# Patient Record
Sex: Female | Born: 1950 | Race: Black or African American | Hispanic: No | Marital: Married | Smoking: Former smoker
Health system: Southern US, Community
[De-identification: ages and names within clinical notes are randomized; demographics above are authoritative.]

## PROBLEM LIST (undated history)

## (undated) DIAGNOSIS — N189 Chronic kidney disease, unspecified: Secondary | ICD-10-CM

## (undated) DIAGNOSIS — E785 Hyperlipidemia, unspecified: Secondary | ICD-10-CM

## (undated) DIAGNOSIS — K759 Inflammatory liver disease, unspecified: Secondary | ICD-10-CM

## (undated) DIAGNOSIS — D869 Sarcoidosis, unspecified: Secondary | ICD-10-CM

## (undated) DIAGNOSIS — E119 Type 2 diabetes mellitus without complications: Secondary | ICD-10-CM

## (undated) DIAGNOSIS — D649 Anemia, unspecified: Secondary | ICD-10-CM

## (undated) HISTORY — PX: ABDOMINAL HYSTERECTOMY: SHX81

## (undated) HISTORY — DX: Type 2 diabetes mellitus without complications: E11.9

---

## 2008-04-30 ENCOUNTER — Ambulatory Visit: Payer: Self-pay | Admitting: Cardiology

## 2008-05-07 ENCOUNTER — Ambulatory Visit: Payer: Self-pay | Admitting: Cardiology

## 2010-09-30 NOTE — Assessment & Plan Note (Signed)
[place] [facility] OFFICE NOTE   NAME:Kristen Mcknight, Kristen Mcknight                       MRN:          [id number]  DATE:04/30/2008                            DOB:          Apr 12, 1951    REFERRING PHYSICIAN:  Consuello Masse.   REASON FOR CONSULTATION:  Chest pain and abnormal electrocardiogram.   HISTORY OF PRESENT ILLNESS:  Kristen Mcknight is a very pleasant 60 year old  woman with a history of hyperlipidemia and sarcoidosis, apparently in  remission.  She reports being diagnosed with sarcoidosis approximately 5  years ago and was on steroids for a temporary treatment course, although  has been very stable recently.  She has no long-term history of  medication-treated hypertension or type 2 diabetes mellitus.  She also  has no personal history of cardiovascular disease.  She has been  experiencing intermittent chest pressure in sporadic fashion over  several weeks.  Sometimes these symptoms may last up to a day at a time  and she states that she takes a baby aspirin ultimately with her  symptoms subsiding.  These are nonexertional and can be exacerbated by  arm movement and twisting sometimes but not exclusively.  She has  undergone no prior cardiac risk stratification.  Reviewing her  electrocardiograms, she does have diminished R-wave progression  throughout the precordium and a leftward axis with nonspecific ST-T-wave  changes.   ALLERGIES:  CODEINE.   MEDICATIONS:  Zocor 20 mg p.o. at bedtime.   PAST MEDICAL HISTORY:  The patient is status post hysterectomy in 1998.  She has hyperlipidemia treated with Zocor.  Recent lipids in November  showed an LDL of 124, HDL 38, total cholesterol 184, and triglycerides  of 109.  She has no history of thyroid disease, renal disease, or  cancer.   FAMILY HISTORY:  Reviewed.  The patient's mother died at age 55 with  asthma and myocardial infarction.  Her father died at age 39 with lung  cancer.  She has 8 sisters and 4 brothers, none with premature  cardiovascular disease.   SOCIAL HISTORY:  The patient is married, currently separated.  She has 2  children.  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  She had a previous history of tobacco use.  Drinks alcoholic beverage on occasion.  She exercises perhaps twice a  week by walking.   REVIEW OF SYSTEMS:  As outlined above.  She does have occasional  constipation.  Otherwise negative.   PHYSICAL EXAMINATION:  VITAL SIGNS:  Blood pressure is 128/80, heart  rate is 76, weight is 171 pounds.  GENERAL:  A normally nourished-appearing woman in no acute distress.  HEENT:  Conjunctiva is normal.  She does have resolving ecchymoses in  her lower orbital area.  She states that she recently had a fall,  otherwise with no major injury.  Oropharynx is clear.  NECK:  Supple.  No elevated jugular venous pressure.  No loud bruits.  No thyromegaly.  LUNGS:  Clear without labored breathing at rest.  CARDIOVASCULAR:  Regular rate and rhythm.  No obvious pericardial  rub or  S3 gallop.  No pathologic systolic murmur.  ABDOMEN:  Soft, nontender.  EXTREMITIES:  No frank pitting edema.  Pulses are 1-2+.  SKIN:  Warm and dry.  MUSCULOSKELETAL:  No kyphosis noted.  NEUROPSYCHIATRIC:  The patient is alert and oriented x3.  Affect is  appropriate.   IMPRESSION AND RECOMMENDATIONS:  1. Chest pain syndrome with somewhat atypical features in the setting      of hyperlipidemia and a previous history of tobacco use (quit in      January).  Her electrocardiogram is abnormal with decreased R-wave      progression and nonspecific ST-T-wave changes.  We discussed      further risk stratification and we will plan an exercise      echocardiogram.  If this is normal, it would suggest a very low      risk for underlying obstructive coronary artery disease and      myocardial infarction.  In that case, we will  refer her back to Dr.      Quintin Alto for ongoing care.  If abnormal results are noted, then we      will discuss situation further here in the office.  2. Hyperlipidemia, presently on Zocor.  This is being managed by Dr.      Quintin Alto.  3. History of sarcoidosis, reportedly in remission.     Satira Sark, MD  Electronically Signed    SGM/MedQ  DD: 04/30/2008  DT: 05/01/2008  Job #: [id number]   cc:   Consuello Masse

## 2015-05-09 ENCOUNTER — Inpatient Hospital Stay (HOSPITAL_COMMUNITY)
Admission: EM | Admit: 2015-05-09 | Discharge: 2015-05-13 | DRG: 690 | Disposition: A | Discharge status: Home / Self Care | Payer: PRIVATE HEALTH INSURANCE | Attending: Internal Medicine | Admitting: Internal Medicine

## 2015-05-09 ENCOUNTER — Encounter (HOSPITAL_COMMUNITY): Payer: Self-pay | Admitting: *Deleted

## 2015-05-09 ENCOUNTER — Emergency Department (HOSPITAL_COMMUNITY): Payer: PRIVATE HEALTH INSURANCE

## 2015-05-09 DIAGNOSIS — Z87891 Personal history of nicotine dependence: Secondary | ICD-10-CM | POA: Diagnosis not present

## 2015-05-09 DIAGNOSIS — E785 Hyperlipidemia, unspecified: Secondary | ICD-10-CM | POA: Diagnosis present

## 2015-05-09 DIAGNOSIS — Z801 Family history of malignant neoplasm of trachea, bronchus and lung: Secondary | ICD-10-CM

## 2015-05-09 DIAGNOSIS — Z79899 Other long term (current) drug therapy: Secondary | ICD-10-CM | POA: Diagnosis not present

## 2015-05-09 DIAGNOSIS — R310 Gross hematuria: Secondary | ICD-10-CM | POA: Diagnosis present

## 2015-05-09 DIAGNOSIS — N189 Chronic kidney disease, unspecified: Secondary | ICD-10-CM | POA: Diagnosis present

## 2015-05-09 DIAGNOSIS — Z885 Allergy status to narcotic agent status: Secondary | ICD-10-CM | POA: Diagnosis not present

## 2015-05-09 DIAGNOSIS — N10 Acute tubulo-interstitial nephritis: Secondary | ICD-10-CM | POA: Diagnosis present

## 2015-05-09 DIAGNOSIS — Z825 Family history of asthma and other chronic lower respiratory diseases: Secondary | ICD-10-CM | POA: Diagnosis not present

## 2015-05-09 DIAGNOSIS — Z8249 Family history of ischemic heart disease and other diseases of the circulatory system: Secondary | ICD-10-CM

## 2015-05-09 DIAGNOSIS — B182 Chronic viral hepatitis C: Secondary | ICD-10-CM | POA: Diagnosis present

## 2015-05-09 DIAGNOSIS — N179 Acute kidney failure, unspecified: Secondary | ICD-10-CM | POA: Diagnosis present

## 2015-05-09 DIAGNOSIS — N3289 Other specified disorders of bladder: Secondary | ICD-10-CM | POA: Diagnosis present

## 2015-05-09 DIAGNOSIS — D62 Acute posthemorrhagic anemia: Secondary | ICD-10-CM | POA: Diagnosis present

## 2015-05-09 DIAGNOSIS — D869 Sarcoidosis, unspecified: Secondary | ICD-10-CM | POA: Diagnosis present

## 2015-05-09 HISTORY — DX: Chronic kidney disease, unspecified: N18.9

## 2015-05-09 HISTORY — DX: Hyperlipidemia, unspecified: E78.5

## 2015-05-09 HISTORY — DX: Anemia, unspecified: D64.9

## 2015-05-09 HISTORY — DX: Inflammatory liver disease, unspecified: K75.9

## 2015-05-09 LAB — I-STAT CHEM 8, ED
BUN: 41 mg/dL — AB (ref 6–20)
CHLORIDE: 111 mmol/L (ref 101–111)
CREATININE: 4.7 mg/dL — AB (ref 0.44–1.00)
Calcium, Ion: 1.13 mmol/L (ref 1.13–1.30)
Glucose, Bld: 102 mg/dL — ABNORMAL HIGH (ref 65–99)
HEMATOCRIT: 23 % — AB (ref 36.0–46.0)
Hemoglobin: 7.8 g/dL — ABNORMAL LOW (ref 12.0–15.0)
Potassium: 3.8 mmol/L (ref 3.5–5.1)
Sodium: 142 mmol/L (ref 135–145)
TCO2: 19 mmol/L (ref 0–100)

## 2015-05-09 LAB — I-STAT CG4 LACTIC ACID, ED: Lactic Acid, Venous: 1.5 mmol/L (ref 0.5–2.0)

## 2015-05-09 LAB — CBC WITH DIFFERENTIAL/PLATELET
BASOS ABS: 0 10*3/uL (ref 0.0–0.1)
BASOS PCT: 0 %
EOS ABS: 0.3 10*3/uL (ref 0.0–0.7)
EOS PCT: 3 %
HEMATOCRIT: 21.8 % — AB (ref 36.0–46.0)
Hemoglobin: 7.2 g/dL — ABNORMAL LOW (ref 12.0–15.0)
Lymphocytes Relative: 16 %
Lymphs Abs: 1.6 10*3/uL (ref 0.7–4.0)
MCH: 30.4 pg (ref 26.0–34.0)
MCHC: 33 g/dL (ref 30.0–36.0)
MCV: 92 fL (ref 78.0–100.0)
MONO ABS: 0.6 10*3/uL (ref 0.1–1.0)
MONOS PCT: 6 %
Neutro Abs: 7.6 10*3/uL (ref 1.7–7.7)
Neutrophils Relative %: 75 %
PLATELETS: 312 10*3/uL (ref 150–400)
RBC: 2.37 MIL/uL — ABNORMAL LOW (ref 3.87–5.11)
RDW: 14.3 % (ref 11.5–15.5)
WBC: 10.1 10*3/uL (ref 4.0–10.5)

## 2015-05-09 LAB — APTT: APTT: 39 s — AB (ref 24–37)

## 2015-05-09 LAB — PROTIME-INR
INR: 1.19 (ref 0.00–1.49)
PROTHROMBIN TIME: 15.2 s (ref 11.6–15.2)

## 2015-05-09 LAB — ABO/RH: ABO/RH(D): O POS

## 2015-05-09 MED ORDER — TETRAHYDROZOLINE HCL 0.05 % OP SOLN: 1.0000 [drp] | Freq: Every day | OPHTHALMIC | Status: DC | PRN

## 2015-05-09 MED ORDER — SODIUM CHLORIDE 0.9 % IJ SOLN
3.0000 mL | Freq: Two times a day (BID) | INTRAMUSCULAR | Status: DC
  Administered 2015-05-12 – 2015-05-13 (×3): 3 mL via INTRAVENOUS

## 2015-05-09 MED ORDER — PANTOPRAZOLE SODIUM 20 MG PO TBEC
20.0000 mg | DELAYED_RELEASE_TABLET | Freq: Every day | ORAL | Status: DC
  Administered 2015-05-11 – 2015-05-13 (×3): 20 mg via ORAL
  Filled 2015-05-09 (×6): qty 1

## 2015-05-09 MED ORDER — SODIUM CHLORIDE 0.9 % IV BOLUS (SEPSIS)
1000.0000 mL | Freq: Once | INTRAVENOUS | Status: AC
  Administered 2015-05-09: 1000 mL via INTRAVENOUS

## 2015-05-09 MED ORDER — SODIUM CHLORIDE 0.9 % IV SOLN
INTRAVENOUS | Status: DC
  Administered 2015-05-09 – 2015-05-11 (×4): via INTRAVENOUS

## 2015-05-09 MED ORDER — DEXTROSE 5 % IV SOLN
1.0000 g | INTRAVENOUS | Status: DC
  Administered 2015-05-09 – 2015-05-12 (×4): 1 g via INTRAVENOUS
  Filled 2015-05-09 (×4): qty 10

## 2015-05-09 MED ORDER — ACETAMINOPHEN 650 MG RE SUPP: 650.0000 mg | Freq: Four times a day (QID) | RECTAL | Status: DC | PRN

## 2015-05-09 MED ORDER — POLYETHYLENE GLYCOL 3350 17 G PO PACK: 17.0000 g | PACK | Freq: Every day | ORAL | Status: DC | PRN

## 2015-05-09 MED ORDER — ACETAMINOPHEN 325 MG PO TABS: 650.0000 mg | ORAL_TABLET | Freq: Four times a day (QID) | ORAL | Status: DC | PRN

## 2015-05-09 MED ORDER — ONDANSETRON HCL 4 MG/2ML IJ SOLN: 4.0000 mg | Freq: Three times a day (TID) | INTRAMUSCULAR | Status: AC | PRN

## 2015-05-09 NOTE — ED Notes (Signed)
Pt is here from [facility], she was being evaluated in the ER and was found to have blood in urine. She reported that she had a biopsy in her kidneys Thurs. For kidney failure. Per her previous lab work she was in kidney failure and that is why the biopsy was requested. Results unknown. EMS reports that her doctor requested the transfer.

## 2015-05-09 NOTE — Consult Note (Signed)
Urology Consult  Referring physician: Dr. Eulis Foster Reason for referral: Hematuria, clot retention  Chief Complaint: suprapubic pain  History of Present Illness: Ms Kristen Mcknight is a 64yo with a hx of interstitial nephritis who presents to [facility] today with complaints of gross hematuria and inability to urinate. CT scan shows a large clot burden in the bladder. She underwent kidney biopsy 1 week ago which was complicated by gross hematuria following the biopsy. Her urine cleared after 2 days and she was discharged home. Yesterday she started having gross hematuria and today she was unable to urinate. She denies any dysuria but did have urgency prior to foley placement. She was transferred on CBI. No previous episodes of gross hematuria or LUTS.   Past Medical History  Diagnosis Date  . Hyperlipidemia    No past surgical history on file.  Medications: I have reviewed the patient's current medications. Allergies:  Allergies  Allergen Reactions  . Codeine Hives    No family history on file. Social History:  has no tobacco, alcohol, and drug history on file.  Review of Systems  Constitutional: Positive for malaise/fatigue.  Gastrointestinal: Positive for nausea.  Genitourinary: Positive for hematuria.  All other systems reviewed and are negative.   Physical Exam:  Vital signs in last 24 hours: Temp:  [98.1 F (36.7 C)] 98.1 F (36.7 C) (12/22 1509) Pulse Rate:  [64] 64 (12/22 1509) Resp:  [16] 16 (12/22 1509) BP: (150)/(68) 150/68 mmHg (12/22 1509) SpO2:  [96 %-98 %] 98 % (12/22 1509) Physical Exam  Constitutional: She is oriented to person, place, and time. She appears well-developed and well-nourished.  HENT:  Head: Normocephalic and atraumatic.  Eyes: EOM are normal. Pupils are equal, round, and reactive to light.  Neck: Normal range of motion. No thyromegaly present.  Cardiovascular: Normal rate and regular rhythm.   Respiratory: Effort normal. No respiratory  distress.  GI: Soft. She exhibits no distension.  Musculoskeletal: Normal range of motion.  Neurological: She is alert and oriented to person, place, and time.  Skin: Skin is warm and dry.  Psychiatric: She has a normal mood and affect. Her behavior is normal. Judgment and thought content normal.    Laboratory Data:  No results found for this or any previous visit (from the past 72 hour(s)). No results found for this or any previous visit (from the past 240 hour(s)). Creatinine: No results for input(s): CREATININE in the last 168 hours. Baseline Creatinine: unknown  Impression/Assessment:  64yo with gross hematuria and clot retention  Plan:  1. 24 French foley catehter was placed and 100cc of clot was evacuated from the bladder. Bedside US confirmed no residual clot. The foley was then exchanged for a 20 French 3 way catheter and CBI was started 2. Continue CBI and titrate urine to light pink 3. Urology to continue to follow   [facility], Kristen Mcknight 05/09/2015, 4:46 PM

## 2015-05-09 NOTE — Progress Notes (Signed)
Continuing the CBI.  Titrating CBI until light pink. No clots seen since the patient arrived from the E.D. @ 1909. Will continue to monitor the patient.

## 2015-05-09 NOTE — ED Notes (Signed)
Bed: WA04 Expected date:  Expected time:  Means of arrival:  Comments: [place] txfr

## 2015-05-09 NOTE — ED Provider Notes (Signed)
CSN: [id number]     Arrival date & time 05/09/15  1503 History   First MD Initiated Contact with Patient 05/09/15 1521     Chief Complaint  Patient presents with  . Hematuria     (Consider location/radiation/quality/duration/timing/severity/associated sxs/prior Treatment) The history is provided by the patient.     Patient is a 64 year old female, who arrived to [facility] ER as a transfer from [place], where she was found to have a large hematoma in her bladder, with hydronephrosis with debris in the renal pelvis, status post left percutaneous kidney biopsy 1 week ago today.  She reports that she was in "critical renal failure" which was what prompted biopsy of her kidneys.  For the past two weeks she reports mild fatigue, but no other symptoms. The biopsy dx her with acute interstitial nephritis - per the pt, the report states acute tubular injury.  The pt states the the day of the bx she had mild hematuria, however the next day urine returned to normal clear and yellow.  She denies hematuria, flank pain, abdominal discomfort or any other symptoms until today. She reports that she was at work this morning at [employer], when she began to have suprapubic pressure, bilateral flank pain, with nausea and vomiting, hot flashes, chills, and then gross hematuria.  She reports that she had low blood pressure at the time she was evaluated there, and labs were obtained which were pertinent for BUN/sCR of 45/4.72, anion gap 24, lactic acid 2.3, leukocytosis 13.0, anemia Hb 8.5 down from 1 week prior Hb 9.4.  CT of her abdomen/pelvis revealed blood clots in her bladder, debris in her left kidney, with hydronephrosis.  There was apparently no covering physician available at [facility] to see the pt.  Dr. Alyson Ingles (urology) was notified about the pt prior to ER to ER transfer.  He was     Past Medical History  Diagnosis Date  . Hyperlipidemia   . Chronic kidney disease   . Anemia   . Hepatitis    Past  Surgical History  Procedure Laterality Date  . Abdominal hysterectomy     Family History  Problem Relation Age of Onset  . Asthma Mother   . CAD Mother   . Lung cancer Father   . Lupus Other    Social History  Substance Use Topics  . Smoking status: Former Smoker    Quit date: 05/08/2014  . Smokeless tobacco: Never Used  . Alcohol Use: 1.8 oz/week    3 Glasses of wine per week   OB History    No data available     Review of Systems  Constitutional: Positive for chills and fatigue. Negative for fever, activity change and appetite change.  HENT: Negative.   Eyes: Negative.   Respiratory: Negative.  Negative for chest tightness and shortness of breath.   Cardiovascular: Negative.  Negative for chest pain, palpitations and leg swelling.  Gastrointestinal: Positive for nausea, vomiting and abdominal pain. Negative for diarrhea, constipation, blood in stool and abdominal distention.  Genitourinary: Positive for hematuria, flank pain and difficulty urinating. Negative for dysuria and menstrual problem.  Musculoskeletal: Negative.   Skin: Negative.  Negative for color change, pallor and rash.  Neurological: Negative.   Hematological: Negative.   Psychiatric/Behavioral: Negative.       Allergies  Codeine  Home Medications   Prior to Admission medications   Medication Sig Start Date End Date Taking? Authorizing Provider  fluticasone (FLONASE) 50 MCG/ACT nasal spray Place 2 sprays into  both nostrils daily as needed for allergies or rhinitis.   Yes Historical Provider, MD  loratadine (CLARITIN) 10 MG tablet Take 10 mg by mouth daily as needed for allergies.   Yes Historical Provider, MD  naproxen sodium (ANAPROX) 220 MG tablet Take 220 mg by mouth daily as needed (pain).   Yes Historical Provider, MD  nitroGLYCERIN (NITROSTAT) 0.4 MG SL tablet Place 0.4 mg under the tongue every 5 (five) minutes as needed for chest pain.    Yes Historical Provider, MD  pantoprazole (PROTONIX)  20 MG tablet Take 20 mg by mouth daily.   Yes Historical Provider, MD  simvastatin (ZOCOR) 40 MG tablet Take 40 mg by mouth daily.   Yes Historical Provider, MD  Tetrahydrozoline HCl (VISINE OP) Apply 1 drop to eye daily as needed (dry eyes).   Yes Historical Provider, MD   BP 120/55 mmHg  Pulse 76  Temp(Src) 98.8 F (37.1 C) (Oral)  Resp 16  Ht 5' 6"  (1.676 m)  Wt 72 kg  BMI 25.63 kg/m2  SpO2 98% Physical Exam  Constitutional: She is oriented to person, place, and time. She appears well-developed and well-nourished. No distress.  Well appearing female, laying comfortably in ER gurney  HENT:  Head: Normocephalic and atraumatic.  Nose: Nose normal.  Mouth/Throat: No oropharyngeal exudate.  MM dry, no pallor [place]bra conjunctiva pale pink  Eyes: Conjunctivae and EOM are normal. Pupils are equal, round, and reactive to light. Right eye exhibits no discharge. Left eye exhibits no discharge. No scleral icterus.  Neck: Normal range of motion. No JVD present. No tracheal deviation present. No thyromegaly present.  Cardiovascular: Normal rate, regular rhythm, normal heart sounds and intact distal pulses.  Exam reveals no gallop and no friction rub.   No murmur heard. Pulmonary/Chest: Effort normal. No respiratory distress. She has no wheezes. She has rales. She exhibits no tenderness.  Bibasilar fine rales, no rhonchi, no wheeze, no respiratory distress  Abdominal: Soft. Bowel sounds are normal. She exhibits no distension and no mass. There is tenderness. There is no rebound and no guarding.  Mildly tender suprapubic area, no guarding or rebound No CVA tenderness  Musculoskeletal: Normal range of motion. She exhibits no edema or tenderness.  Lymphadenopathy:    She has no cervical adenopathy.  Neurological: She is alert and oriented to person, place, and time. She has normal reflexes. No cranial nerve deficit. She exhibits normal muscle tone. Coordination normal.  Skin: Skin is warm and  dry. No rash noted. She is not diaphoretic. No erythema. No pallor.  Psychiatric: She has a normal mood and affect. Her behavior is normal. Judgment and thought content normal.  Nursing note and vitals reviewed.   ED Course  Procedures (including critical care time) Labs Review Labs Reviewed  APTT - Abnormal; Notable for the following:    aPTT 39 (*)    All other components within normal limits  BASIC METABOLIC PANEL - Abnormal; Notable for the following:    Chloride 113 (*)    CO2 21 (*)    Glucose, Bld 111 (*)    BUN 45 (*)    Creatinine, Ser 4.95 (*)    Calcium 8.6 (*)    GFR calc non Af Amer 9 (*)    GFR calc Af Amer 10 (*)    All other components within normal limits  CBC - Abnormal; Notable for the following:    RBC 2.41 (*)    Hemoglobin 7.4 (*)    HCT 22.3 (*)  All other components within normal limits  CBC WITH DIFFERENTIAL/PLATELET - Abnormal; Notable for the following:    RBC 2.37 (*)    Hemoglobin 7.2 (*)    HCT 21.8 (*)    All other components within normal limits  I-STAT CHEM 8, ED - Abnormal; Notable for the following:    BUN 41 (*)    Creatinine, Ser 4.70 (*)    Glucose, Bld 102 (*)    Hemoglobin 7.8 (*)    HCT 23.0 (*)    All other components within normal limits  URINE CULTURE  PROTIME-INR  CBC WITH DIFFERENTIAL/PLATELET  CBC  CBC  CBC  CBC  I-STAT CG4 LACTIC ACID, ED  I-STAT CG4 LACTIC ACID, ED  TYPE AND SCREEN  ABO/RH  PREPARE RBC (CROSSMATCH)    Imaging Review Dg Chest 2 View  05/09/2015  CLINICAL DATA:  Gross hematuria and unable to urinate. EXAM: CHEST  2 VIEW COMPARISON:  05/09/2015 FINDINGS: Heart and mediastinum are within normal limits. Trachea is midline. Both lungs are clear without airspace disease or pulmonary edema. No large pleural effusions. Degenerative change along the right side of the thoracic spine. IMPRESSION: No active cardiopulmonary disease. Electronically Signed   By: Markus Daft M.D.   On: 05/09/2015 18:18   I  have personally reviewed and evaluated these images and lab results as part of my medical decision-making.   EKG Interpretation None      MDM   Pt tx here from [facility] to see Urology, pt was seen, NAD, VSS, appears comfortable and stable, declines pain medication. Last ate at 11 am.  Was instructed to remain NPO Dr. Noah Delaine contacted - currently at bedside - 4:37 PM   Pt's records from [facility] were reviewed.  Given that she had renal failure (eGFR <15), lactic acid of 2.3, anion gap, and leukocytosis, fluids were ordered with repeat lactic acid, type and screen, CXR  Dr. Alyson Ingles requested that medicine be called to admit.  Labs show improvement of lactic acid to 1.50, BUN/sCr 41/4.70, Hb 7.8, suspect slight decrease in H/H is from dilution from IVF, AG cleared  [facility] hospitalist called for admission. Dr. Florene Glen to admit pt to tele for ARF, hematuria, urinary retention and hydronephrosis secondary to obstructing bladder hematoma.   Final diagnoses:  Pilar Plate hematuria       Delsa Grana, PA-C 05/10/15 6301  Daleen Bo, MD 05/10/15 2347

## 2015-05-09 NOTE — ED Notes (Signed)
Delay in lab draw, urologist in room at this time.

## 2015-05-09 NOTE — ED Notes (Signed)
Pt can go to floor at 18:40

## 2015-05-09 NOTE — H&P (Signed)
[facility] History and Physical  [place] Krekel [id number] DOB: January 13, 1951 DOA: 05/09/2015  Referring physician: ED physician PCP: Manon Hilding, MD  Specialists: [place]  Chief Complaint: Gross hematuria, nausea, vomiting  HPI: Shantez Wiker is a 64 y.o. female with PMH of sarcoidosis in remission, anemia, chronic hepatitis C, hyperlipidemia, and recent diagnosis of acute interstitial nephritis who presents in transfer from [facility] with gross hematuria, dysuria, and suprapubic and flank pain after kidney biopsy one week ago. She describes herself as generally healthy, and was in her usual state until approximately 4 weeks ago when she developed a metallic taste in her mouth. She saw her PCP for this and subsequent blood work revealed acute renal failure. She was referred to the [place], where she had a kidney biopsy one week ago and was diagnosed with acute interstitial nephritis. Following the biopsy, there was gross hematuria initially but this cleared after 2 days and she was discharged home. She was doing quite well at home until gross hematuria returned this morning, which was followed by nausea, vomiting, suprapubic, and flank pain. She was evaluated at [facility] ED, where she was found to be anemic with hemoglobin of 8 and CT abdomen/pelvis demonstrated a large clot burden in the urinary bladder. Foley catheter was placed and CBI initiated. Urologist, Dr. Alyson Ingles, has kindly consulted on the case and recommends hospital admission with CBI. The consultant has placed the patient on Rocephin.  Where does patient live?   At home    Can patient participate in ADLs?  Yes       Review of Systems:   General: no fevers, chills, sweats, weight change, poor appetite, or fatigue HEENT: no blurry vision, hearing changes or sore throat Pulm: no dyspnea, cough, or wheeze CV: no chest pain or palpitations Abd: no nausea, vomiting, diarrhea,  or constipation positive for suprapubic and flank pain  GU: Positive for dysuria, gross hematuria, and oliguria  Ext: no leg edema Neuro: no focal weakness, numbness, or tingling, no vision change or hearing loss Skin: no rash, no wounds MSK: No muscle spasm, no deformity, no red, hot, or swollen joint Heme: No easy bruising or bleeding Travel history: No recent long distant travel    Allergy:  Allergies  Allergen Reactions  . Codeine Hives    Past Medical History  Diagnosis Date  . Hyperlipidemia   . Chronic kidney disease   . Anemia   . Hepatitis     Past Surgical History  Procedure Laterality Date  . Abdominal hysterectomy      Social History:  reports that she quit smoking about a year ago. She has never used smokeless tobacco. She reports that she drinks about 1.8 oz of alcohol per week. Her drug history is not on file.  Family History:  Family History  Problem Relation Age of Onset  . Asthma Mother   . CAD Mother   . Lung cancer Father   . Lupus Other      Prior to Admission medications   Medication Sig Start Date End Date Taking? Authorizing Provider  fluticasone (FLONASE) 50 MCG/ACT nasal spray Place 2 sprays into both nostrils daily as needed for allergies or rhinitis.   Yes Historical Provider, MD  loratadine (CLARITIN) 10 MG tablet Take 10 mg by mouth daily as needed for allergies.   Yes Historical Provider, MD  naproxen sodium (ANAPROX) 220 MG tablet Take 220 mg by mouth daily as needed (pain).   Yes Historical  Provider, MD  nitroGLYCERIN (NITROSTAT) 0.4 MG SL tablet Place 0.4 mg under the tongue every 5 (five) minutes as needed for chest pain.    Yes Historical Provider, MD  pantoprazole (PROTONIX) 20 MG tablet Take 20 mg by mouth daily.   Yes Historical Provider, MD  simvastatin (ZOCOR) 40 MG tablet Take 40 mg by mouth daily.   Yes Historical Provider, MD  Tetrahydrozoline HCl (VISINE OP) Apply 1 drop to eye daily as needed (dry eyes).   Yes Historical  Provider, MD    Physical Exam: Filed Vitals:   05/09/15 1508 05/09/15 1509 05/09/15 1745 05/09/15 1745  BP:  150/68 137/75 137/75  Pulse:  64 64   Temp:  98.1 F (36.7 C)    TempSrc:  Oral    Resp:  16 15 15   SpO2: 96% 98% 100% 100%   General: Not in acute distress HEENT:       Eyes: PERRL, EOMI, no scleral icterus conjunctival pallor noted.       ENT: No discharge from the ears or nose, no pharyngeal ulcers, petechiae or exudate, no tonsillar enlargement.        Neck: No JVD, no bruit, no appreciable mass Heme: No cervical adenopathy, no pallor Cardiac: S1/S2, RRR, No murmurs, No gallops or rubs. Pulm: Good air movement bilaterally. No rales, wheezing, rhonchi or rubs. Abd: Soft, nondistended, nontender, no rebound pain or gaurding, no mass or organomegaly, BS present. Ext: No LE edema bilaterally. 2+DP/PT pulse bilaterally. Musculoskeletal: No gross deformity, no red, hot, swollen joints, no limitation in ROM  Skin: No rashes or wounds on exposed surfaces  Neuro: Alert, oriented X3, PERRL, EOMI. No focal findings Psych: Patient is not overtly psychotic.  Labs on Admission:  Basic Metabolic Panel:  Recent Labs Lab 05/09/15 1712  NA 142  K 3.8  CL 111  GLUCOSE 102*  BUN 41*  CREATININE 4.70*   Liver Function Tests: No results for input(s): AST, ALT, ALKPHOS, BILITOT, PROT, ALBUMIN in the last 168 hours. No results for input(s): LIPASE, AMYLASE in the last 168 hours. No results for input(s): AMMONIA in the last 168 hours. CBC:  Recent Labs Lab 05/09/15 1712  HGB 7.8*  HCT 23.0*   Cardiac Enzymes: No results for input(s): CKTOTAL, CKMB, CKMBINDEX, TROPONINI in the last 168 hours.  BNP (last 3 results) No results for input(s): BNP in the last 8760 hours.  ProBNP (last 3 results) No results for input(s): PROBNP in the last 8760 hours.  CBG: No results for input(s): GLUCAP in the last 168 hours.  Radiological Exams on Admission: Dg Chest 2  View  05/09/2015  CLINICAL DATA:  Gross hematuria and unable to urinate. EXAM: CHEST  2 VIEW COMPARISON:  05/09/2015 FINDINGS: Heart and mediastinum are within normal limits. Trachea is midline. Both lungs are clear without airspace disease or pulmonary edema. No large pleural effusions. Degenerative change along the right side of the thoracic spine. IMPRESSION: No active cardiopulmonary disease. Electronically Signed   By: Markus Daft M.D.   On: 05/09/2015 18:18    EKG: Not done in ED, will obtain as appropriate   Assessment/Plan  1. Gross hematuria - In the setting of renal biopsy one week ago - CT at outside hospital reveals large clot burden in the urinary bladder - Dr. Alyson Ingles of urology has kindly consulted - We will continue CBI and Rocephin per urology - Managing anemia as below - Holding pharmacologic VTE prophylaxis, patient on SCDs  2. Acute interstitial nephritis -  Recent diagnosis, SCr 4.7 on admission - Followed outpatient by [facility] patient's Naprosyn, avoid nephrotoxic agents as feasible - Repeat chemistries in the a.m.  3. Acute blood loss anemia - Hemoglobin 9.6 prior to biopsy last week - 7.8 at the outside hospital, now 7.2, patient asymptomatic - 2 units pRBCs placed on hold with instructions to transfuse for hemoglobin less than 7 - Serial H/H every 6 hours - Hold pharmacologic VTE prophylaxis    DVT ppx:  SCDs  Code Status: Full code Family Communication:  Yes, patient's husband and sister at bed side Disposition Plan: Admit to inpatient   Date of Service 05/09/2015    Vianne Bulls, MD [facility] Pager [phone]  If 7PM-7AM, please contact night-coverage [email or web address] Password [facility] 05/09/2015, 7:24 PM

## 2015-05-10 DIAGNOSIS — D869 Sarcoidosis, unspecified: Secondary | ICD-10-CM

## 2015-05-10 DIAGNOSIS — N10 Acute tubulo-interstitial nephritis: Principal | ICD-10-CM

## 2015-05-10 DIAGNOSIS — R310 Gross hematuria: Secondary | ICD-10-CM | POA: Diagnosis present

## 2015-05-10 DIAGNOSIS — D62 Acute posthemorrhagic anemia: Secondary | ICD-10-CM | POA: Diagnosis present

## 2015-05-10 DIAGNOSIS — B182 Chronic viral hepatitis C: Secondary | ICD-10-CM

## 2015-05-10 LAB — CBC
HCT: 22.3 % — ABNORMAL LOW (ref 36.0–46.0)
HCT: 22.4 % — ABNORMAL LOW (ref 36.0–46.0)
HEMATOCRIT: 22 % — AB (ref 36.0–46.0)
HEMOGLOBIN: 7.4 g/dL — AB (ref 12.0–15.0)
HEMOGLOBIN: 7.4 g/dL — AB (ref 12.0–15.0)
Hemoglobin: 7.4 g/dL — ABNORMAL LOW (ref 12.0–15.0)
MCH: 30.7 pg (ref 26.0–34.0)
MCH: 30.3 pg (ref 26.0–34.0)
MCH: 30.6 pg (ref 26.0–34.0)
MCHC: 33.2 g/dL (ref 30.0–36.0)
MCHC: 33 g/dL (ref 30.0–36.0)
MCHC: 33.6 g/dL (ref 30.0–36.0)
MCV: 92.5 fL (ref 78.0–100.0)
MCV: 91.8 fL (ref 78.0–100.0)
MCV: 90.9 fL (ref 78.0–100.0)
PLATELETS: 313 10*3/uL (ref 150–400)
Platelets: 308 10*3/uL (ref 150–400)
Platelets: 315 10*3/uL (ref 150–400)
RBC: 2.41 MIL/uL — ABNORMAL LOW (ref 3.87–5.11)
RBC: 2.44 MIL/uL — AB (ref 3.87–5.11)
RBC: 2.42 MIL/uL — ABNORMAL LOW (ref 3.87–5.11)
RDW: 14.2 % (ref 11.5–15.5)
RDW: 14.3 % (ref 11.5–15.5)
RDW: 14.1 % (ref 11.5–15.5)
WBC: 9.1 10*3/uL (ref 4.0–10.5)
WBC: 8.8 10*3/uL (ref 4.0–10.5)
WBC: 9.2 10*3/uL (ref 4.0–10.5)

## 2015-05-10 LAB — BASIC METABOLIC PANEL
Anion gap: 8 (ref 5–15)
BUN: 45 mg/dL — AB (ref 6–20)
CALCIUM: 8.6 mg/dL — AB (ref 8.9–10.3)
CHLORIDE: 113 mmol/L — AB (ref 101–111)
CO2: 21 mmol/L — ABNORMAL LOW (ref 22–32)
CREATININE: 4.95 mg/dL — AB (ref 0.44–1.00)
GFR calc Af Amer: 10 mL/min — ABNORMAL LOW (ref >60)
GFR calc non Af Amer: 9 mL/min — ABNORMAL LOW (ref >60)
Glucose, Bld: 111 mg/dL — ABNORMAL HIGH (ref 65–99)
Potassium: 4 mmol/L (ref 3.5–5.1)
SODIUM: 142 mmol/L (ref 135–145)

## 2015-05-10 MED ORDER — ONDANSETRON HCL 4 MG/2ML IJ SOLN: 4.0000 mg | Freq: Four times a day (QID) | INTRAMUSCULAR | Status: DC | PRN

## 2015-05-10 MED ORDER — SODIUM CHLORIDE 0.9 % IV SOLN: Freq: Once | INTRAVENOUS | Status: DC

## 2015-05-10 NOTE — Care Management Note (Signed)
Case Management Note  Patient Details  Name: Kristen Mcknight MRN: [id number] Date of Birth: Nov 06, 1950  Subjective/Objective:  64 y/o f admitted w/Gross hematuria. From home.                  Action/Plan:d/c plan home.   Expected Discharge Date:                  Expected Discharge Plan:  Home/Self Care  In-House Referral:     Discharge planning Services  CM Consult  Post Acute Care Choice:    Choice offered to:     DME Arranged:    DME Agency:     HH Arranged:    HH Agency:     Status of Service:  In process, will continue to follow  Medicare Important Message Given:    Date Medicare IM Given:    Medicare IM give by:    Date Additional Medicare IM Given:    Additional Medicare Important Message give by:     If discussed at [place] of Stay Meetings, dates discussed:    Additional Comments:  Dessa Phi, RN 05/10/2015, 12:19 PM

## 2015-05-10 NOTE — Progress Notes (Signed)
  Subjective: Pt is s/p clot evacuation yesterday. Urine light pink today on slow drip CBI. She denies any suprapubic pain  Objective: Vital signs in last 24 hours: Temp:  [98.2 F (36.8 C)-98.8 F (37.1 C)] 98.8 F (37.1 C) (12/23 1423) Pulse Rate:  [64-83] 83 (12/23 1423) Resp:  [15-18] 18 (12/23 1423) BP: (103-137)/(48-75) 103/48 mmHg (12/23 1423) SpO2:  [98 %-100 %] 99 % (12/23 1423) Weight:  [72 kg (158 lb 11.7 oz)-73.483 kg (162 lb)] 72 kg (158 lb 11.7 oz) (12/23 0437)  Intake/Output from previous day: 12/22 0701 - 12/23 0700 In: 26982.5 [P.O.:440; I.V.:92.5; IV Piggyback:50] Out: [id number] [Urine:25400] Intake/Output this shift: Total I/O In: 400 [I.V.:400] Out: 2000 [Urine:2000]  Physical Exam:  General:alert, cooperative and appears stated age GI: soft, non tender, normal bowel sounds, no palpable masses, no organomegaly, no inguinal hernia Female genitalia: not done Extremities: extremities normal, atraumatic, no cyanosis or edema  Lab Results:  Recent Labs  05/10/15 0130 05/10/15 0646 05/10/15 1250  HGB 7.4* 7.4* 7.4*  HCT 22.3* 22.4* 22.0*   BMET  Recent Labs  05/09/15 1712 05/10/15 0130  NA 142 142  K 3.8 4.0  CL 111 113*  CO2  --  21*  GLUCOSE 102* 111*  BUN 41* 45*  CREATININE 4.70* 4.95*  CALCIUM  --  8.6*    Recent Labs  05/09/15 1952  INR 1.19   No results for input(s): LABURIN in the last 72 hours. No results found for this or any previous visit.  Studies/Results: Dg Chest 2 View  05/09/2015  CLINICAL DATA:  Gross hematuria and unable to urinate. EXAM: CHEST  2 VIEW COMPARISON:  05/09/2015 FINDINGS: Heart and mediastinum are within normal limits. Trachea is midline. Both lungs are clear without airspace disease or pulmonary edema. No large pleural effusions. Degenerative change along the right side of the thoracic spine. IMPRESSION: No active cardiopulmonary disease. Electronically Signed   By: Markus Daft M.D.   On: 05/09/2015 18:18     Assessment/Plan: 64yo with gross hematuria and clot retention after renal biopsy  Recs: 1. Continue to wean CBI to offi 2. Urology to continue to follow   LOS: 1 day   Leily Capek L 05/10/2015, 3:51 PM

## 2015-05-10 NOTE — Progress Notes (Signed)
Patient ID: Kristen Mcknight, female   DOB: [birth year], 64 y.o.   MRN: [id number]  [facility] PROGRESS NOTE  Kristen Mcknight [id number] DOB: Nov 22, 1950 DOA: 05/09/2015 PCP: Manon Hilding, MD   Brief narrative:    64 y.o. female with PMH of sarcoidosis in remission, anemia, chronic hepatitis C, hyperlipidemia, and recent diagnosis of acute interstitial nephritis who presented in transfer from [facility] with gross hematuria, dysuria, and suprapubic and flank pain after kidney biopsy one week ago. Pt was in her usual state until approximately 4 weeks ago when she developed a metallic taste in her mouth. She saw her PCP for this and subsequent blood work revealed acute renal failure. She was referred to the [place], where she had a kidney biopsy one week ago and was diagnosed with acute interstitial nephritis. Following the biopsy, there was gross hematuria initially but this cleared after 2 days and she was discharged home. She was doing quite well at home until gross hematuria returned associated with nausea, vomiting, suprapubic, and flank pain. She was evaluated at [facility] ED, where she was found to be anemic with hemoglobin of 8 and CT abdomen/pelvis demonstrated a large clot burden in the urinary bladder. Foley catheter was placed and CBI initiated. Urologist, Dr. Alyson Ingles, has kindly consulted on the case and recommends hospital admission with CBI. The consultant has placed the patient on Rocephin.  Assessment/Plan:    Principal Problem:   Pilar Plate hematuria - gross hematuria and clot retention - 24 French foley catehter was placed and 100cc of clot was evacuated from the bladder - bedside US confirmed no residual clot. The foley was then exchanged for a 20 [place] 3 way catheter and CBI was started - continue CBI and titrate urine to light pink - appreciate urology team assistance   Active Problems:   Acute renal failure (ARF) ([place]) - continue IVF, avoid  nephrotoxic mediations such as NSAID's - monitor BMP    Acute interstitial nephritis - continue Rocephin day #2    Acute blood loss anemia - secondary to principal problem - CBC in AM and transfuse for Hg <7    Hep C w/o coma, chronic ([place]) - stable for now    Sarcoidosis ([place]) - outpatient follow up  DVT prophylaxis - SCD's  Code Status: Full.  Family Communication:  plan of care discussed with the patient Disposition Plan: Home when cleared by urology   IV access:  Peripheral IV  Procedures and diagnostic studies:    Dg Chest 2 View 05/09/2015  No active cardiopulmonary disease.   Medical Consultants:  Urology   Other Consultants:  None   IAnti-Infectives:   Rocephin 12/22 -->  Faye Ramsay, MD  [facility] Pager [phone]  If 7PM-7AM, please contact night-coverage [email or web address] Password [email or web address] 05/10/2015, 1:17 PM   LOS: 1 day   HPI/Subjective: No events overnight.   Objective: Filed Vitals:   05/09/15 1745 05/09/15 1745 05/09/15 1923 05/10/15 0437  BP: 137/75 137/75 131/53 120/55  Pulse: 64  66 76  Temp:   98.2 F (36.8 C) 98.8 F (37.1 C)  TempSrc:   Oral Oral  Resp: 15 15  16   Height:   5\' 6"  (1.676 m)   Weight:   73.483 kg (162 lb) 72 kg (158 lb 11.7 oz)  SpO2: 100% 100% 100% 98%    Intake/Output Summary (Last 24 hours) at 05/10/15 1317 Last data filed at 05/10/15 1126  Gross per 24 hour  Intake 26982.5 ml  Output  26400 ml  Net  582.5 ml    Exam:   General:  Pt is alert, follows commands appropriately, not in acute distress  Cardiovascular: Regular rate and rhythm, S1/S2, no murmurs, no rubs, no gallops  Respiratory: Clear to auscultation bilaterally, no wheezing, no crackles, no rhonchi  Abdomen: Soft, non tender, non distended, bowel sounds present, no guarding   Data Reviewed: Basic Metabolic Panel:  Recent Labs Lab 05/09/15 1712 05/10/15 0130  NA 142 142  K 3.8 4.0  CL 111 113*  CO2  --  21*  GLUCOSE 102* 111*   BUN 41* 45*  CREATININE 4.70* 4.95*  CALCIUM  --  8.6*    CBC:  Recent Labs Lab 05/09/15 1712 05/09/15 2019 05/10/15 0130 05/10/15 0646  WBC  --  10.1 9.1 8.8  NEUTROABS  --  7.6  --   --   HGB 7.8* 7.2* 7.4* 7.4*  HCT 23.0* 21.8* 22.3* 22.4*  MCV  --  92.0 92.5 91.8  PLT  --  312 313 308   Scheduled Meds: . sodium chloride   Intravenous Once  . cefTRIAXone (ROCEPHIN)  IV  1 g Intravenous Q24H  . pantoprazole  20 mg Oral Daily  . sodium chloride  3 mL Intravenous Q12H   Continuous Infusions: . sodium chloride 50 mL/hr at 05/10/15 0056

## 2015-05-11 LAB — CBC
HEMATOCRIT: 21 % — AB (ref 36.0–46.0)
HEMOGLOBIN: 7.1 g/dL — AB (ref 12.0–15.0)
MCH: 30.9 pg (ref 26.0–34.0)
MCHC: 33.8 g/dL (ref 30.0–36.0)
MCV: 91.3 fL (ref 78.0–100.0)
Platelets: 302 10*3/uL (ref 150–400)
RBC: 2.3 MIL/uL — ABNORMAL LOW (ref 3.87–5.11)
RDW: 14.4 % (ref 11.5–15.5)
WBC: 8.4 10*3/uL (ref 4.0–10.5)

## 2015-05-11 LAB — BASIC METABOLIC PANEL
ANION GAP: 10 (ref 5–15)
BUN: 42 mg/dL — ABNORMAL HIGH (ref 6–20)
CHLORIDE: 114 mmol/L — AB (ref 101–111)
CO2: 18 mmol/L — AB (ref 22–32)
Calcium: 8.4 mg/dL — ABNORMAL LOW (ref 8.9–10.3)
Creatinine, Ser: 5.08 mg/dL — ABNORMAL HIGH (ref 0.44–1.00)
GFR calc non Af Amer: 8 mL/min — ABNORMAL LOW (ref >60)
GFR, EST AFRICAN AMERICAN: 10 mL/min — AB (ref >60)
GLUCOSE: 97 mg/dL (ref 65–99)
POTASSIUM: 4.2 mmol/L (ref 3.5–5.1)
Sodium: 142 mmol/L (ref 135–145)

## 2015-05-11 LAB — URINE CULTURE: CULTURE: NO GROWTH

## 2015-05-11 MED ORDER — SODIUM CHLORIDE 0.9 % IV SOLN
250.0000 mg | Freq: Every day | INTRAVENOUS | Status: DC
  Administered 2015-05-11: 250 mg via INTRAVENOUS
  Filled 2015-05-11 (×2): qty 2

## 2015-05-11 MED ORDER — SODIUM CHLORIDE 0.9 % IV SOLN: 250.0000 mg | Freq: Once | INTRAVENOUS | Status: DC

## 2015-05-11 NOTE — Progress Notes (Addendum)
Patient ID: Kristen Mcknight, female   DOB: October 24, 1950, 64 y.o.   MRN: [id number]  [facility] PROGRESS NOTE  Kristen Mcknight [id number] DOB: 10-27-1950 DOA: 05/09/2015 PCP: Manon Hilding, MD   Brief narrative:    64 y.o. female with PMH of sarcoidosis in remission, anemia, chronic hepatitis C, hyperlipidemia, and recent diagnosis of acute interstitial nephritis who presented in transfer from [facility] with gross hematuria, dysuria, and suprapubic and flank pain after kidney biopsy one week ago. Pt was in her usual state until approximately 4 weeks ago when she developed a metallic taste in her mouth. She saw her PCP for this and subsequent blood work revealed acute renal failure. She was referred to the [place], where she had a kidney biopsy one week ago and was diagnosed with acute interstitial nephritis. Following the biopsy, there was gross hematuria initially but this cleared after 2 days and she was discharged home. She was doing quite well at home until gross hematuria returned associated with nausea, vomiting, suprapubic, and flank pain. She was evaluated at [facility] ED, where she was found to be anemic with hemoglobin of 8 and CT abdomen/pelvis demonstrated a large clot burden in the urinary bladder. Foley catheter was placed and CBI initiated. Urologist, Dr. Alyson Ingles, has kindly consulted on the case and recommends hospital admission with CBI. The consultant has placed the patient on Rocephin.  Assessment/Plan:    Principal Problem:   Pilar Plate hematuria - gross hematuria and clot retention - 24 French foley catehter was placed and 100cc of clot was evacuated from the bladder - bedside US confirmed no residual clot. The foley was then exchanged for a 68 French 3 way catheter and CBI was started - appreciate urology team assistance   Active Problems:   Acute renal failure (ARF) ([place]) - AIN per renal biopsy  - appreciate Dr. Jason Nest assistance, GI team said it  was OK to start steroids in pt with known hep C - will start solumedrol 250 mg IV QD for 3 days and then transition to oral Prednisone  - monitor BMP - continue Rocephin day #3    Acute blood loss anemia - secondary to principal problem - CBC in AM and transfuse for Hg <7    Hep C w/o coma, chronic ([place]) - stable for now    Sarcoidosis ([place]) - outpatient follow up  DVT prophylaxis - SCD's  Code Status: Full.  Family Communication:  plan of care discussed with the patient Disposition Plan: Home when cleared by urology and nephrology   IV access:  Peripheral IV  Procedures and diagnostic studies:    Dg Chest 2 View 05/09/2015  No active cardiopulmonary disease.   Medical Consultants:  Urology  Nephrology GI  Other Consultants:  None   IAnti-Infectives:   Rocephin 12/22 -->  Faye Ramsay, MD  [facility] Pager [phone]  If 7PM-7AM, please contact night-coverage [email or web address] Password [facility] 05/11/2015, 1:47 PM   LOS: 2 days   HPI/Subjective: No events overnight.   Objective: Filed Vitals:   05/10/15 0437 05/10/15 1423 05/10/15 2315 05/11/15 0450  BP: 120/55 103/48 127/61 117/90  Pulse: 76 83 77 79  Temp: 98.8 F (37.1 C) 98.8 F (37.1 C) 98.7 F (37.1 C) 98.3 F (36.8 C)  TempSrc: Oral Oral Oral Oral  Resp: 16 18 14 16   Height:      Weight: 72 kg (158 lb 11.7 oz)   72.8 kg (160 lb 7.9 oz)  SpO2: 98% 99% 99% 98%  Intake/Output Summary (Last 24 hours) at 05/11/15 1347 Last data filed at 05/11/15 1102  Gross per 24 hour  Intake   3110 ml  Output   4025 ml  Net   -915 ml    Exam:   General:  Pt is alert, follows commands appropriately, not in acute distress  Cardiovascular: Regular rate and rhythm, S1/S2, no murmurs, no rubs, no gallops  Respiratory: Clear to auscultation bilaterally, no wheezing, no crackles, no rhonchi  Abdomen: Soft, non tender, non distended, bowel sounds present, no guarding   Data Reviewed: Basic Metabolic  Panel:  Recent Labs Lab 05/09/15 1712 05/10/15 0130 05/11/15 0518  NA 142 142 142  K 3.8 4.0 4.2  CL 111 113* 114*  CO2  --  21* 18*  GLUCOSE 102* 111* 97  BUN 41* 45* 42*  CREATININE 4.70* 4.95* 5.08*  CALCIUM  --  8.6* 8.4*    CBC:  Recent Labs Lab 05/09/15 2019 05/10/15 0130 05/10/15 0646 05/10/15 1250 05/11/15 0518  WBC 10.1 9.1 8.8 9.2 8.4  NEUTROABS 7.6  --   --   --   --   HGB 7.2* 7.4* 7.4* 7.4* 7.1*  HCT 21.8* 22.3* 22.4* 22.0* 21.0*  MCV 92.0 92.5 91.8 90.9 91.3  PLT 312 313 308 315 302   Scheduled Meds: . sodium chloride   Intravenous Once  . cefTRIAXone (ROCEPHIN)  IV  1 g Intravenous Q24H  . methylPREDNISolone (SOLU-MEDROL) injection  250 mg Intravenous Daily  . pantoprazole  20 mg Oral Daily  . sodium chloride  3 mL Intravenous Q12H   Continuous Infusions: . sodium chloride 50 mL/hr at 05/10/15 1743

## 2015-05-11 NOTE — Consult Note (Signed)
Referring Provider: No ref. provider found Primary Care Physician:  Manon Hilding, MD Primary Nephrologist:  Dr Theador Hawthorne -- [facility]   Reason for Consultation:  Acute Renal insufficiency -- Creatinine 4-5 mg/dl -- November   HPI: 64 y.o. female with PMH of sarcoidosis in remission, anemia, chronic hepatitis C, hyperlipidemia, and recent diagnosis of acute interstitial nephritis who presented in transfer from [facility] with gross hematuria, dysuria, and suprapubic and flank pain after kidney biopsy one week ago.She had a kidney biopsy one week ago and was diagnosed with acute interstitial nephritis.She was evaluated at [facility] ED, where she was found to be anemic with hemoglobin of 8 and CT abdomen/pelvis demonstrated a large clot burden in the urinary bladder. Foley catheter was placed and CBI initiated. Urologist, Dr. Alyson Ingles, has kindly consulted on the case and recommends hospital admission with CBI. The consultant has placed the patient on Rocephin.  Creatinine 4 in November -- Dr Theador Hawthorne -- hep C positive and wanted to have GI evaluation prior to starting steroids  Biopsy showed no evidence of vasculitis or Sarcoid granulomas  Meds   :  Some aleve and ibuprofen use  Past Medical History  Diagnosis Date  . Hyperlipidemia   . Chronic kidney disease   . Anemia   . Hepatitis     Past Surgical History  Procedure Laterality Date  . Abdominal hysterectomy      Prior to Admission medications   Medication Sig Start Date End Date Taking? Authorizing Provider  fluticasone (FLONASE) 50 MCG/ACT nasal spray Place 2 sprays into both nostrils daily as needed for allergies or rhinitis.   Yes Historical Provider, MD  loratadine (CLARITIN) 10 MG tablet Take 10 mg by mouth daily as needed for allergies.   Yes Historical Provider, MD  naproxen sodium (ANAPROX) 220 MG tablet Take 220 mg by mouth daily as needed (pain).   Yes Historical Provider, MD  nitroGLYCERIN (NITROSTAT) 0.4 MG SL tablet Place  0.4 mg under the tongue every 5 (five) minutes as needed for chest pain.    Yes Historical Provider, MD  pantoprazole (PROTONIX) 20 MG tablet Take 20 mg by mouth daily.   Yes Historical Provider, MD  simvastatin (ZOCOR) 40 MG tablet Take 40 mg by mouth daily.   Yes Historical Provider, MD  Tetrahydrozoline HCl (VISINE OP) Apply 1 drop to eye daily as needed (dry eyes).   Yes Historical Provider, MD    Current Facility-Administered Medications  Medication Dose Route Frequency Provider Last Rate Last Dose  . 0.9 %  sodium chloride infusion   Intravenous Continuous Vianne Bulls, MD 50 mL/hr at 05/10/15 1743    . 0.9 %  sodium chloride infusion   Intravenous Once Vianne Bulls, MD      . acetaminophen (TYLENOL) tablet 650 mg  650 mg Oral Q6H PRN Vianne Bulls, MD       Or  . acetaminophen (TYLENOL) suppository 650 mg  650 mg Rectal Q6H PRN Vianne Bulls, MD      . cefTRIAXone (ROCEPHIN) 1 g in dextrose 5 % 50 mL IVPB  1 g Intravenous Q24H Cleon Gustin, MD 100 mL/hr at 05/10/15 1743 1 g at 05/10/15 1743  . ondansetron (ZOFRAN) injection 4 mg  4 mg Intravenous Q6H PRN Theodis Blaze, MD      . pantoprazole (PROTONIX) EC tablet 20 mg  20 mg Oral Daily Ilene Qua Opyd, MD      . polyethylene glycol (MIRALAX / GLYCOLAX) packet 17 g  17 g Oral Daily PRN Ilene Qua Opyd, MD      . sodium chloride 0.9 % injection 3 mL  3 mL Intravenous Q12H Timothy S Opyd, MD      . tetrahydrozoline 0.05 % ophthalmic solution 1 drop  1 drop Both Eyes Daily PRN Vianne Bulls, MD        Allergies as of 05/09/2015 - Review Complete 05/09/2015  Allergen Reaction Noted  . Codeine Hives 05/09/2015    Family History  Problem Relation Age of Onset  . Asthma Mother   . CAD Mother   . Lung cancer Father   . Lupus Other     Social History   Social History  . Marital Status: Married    Spouse Name: N/A  . Number of Children: N/A  . Years of Education: N/A   Occupational History  . Not on file.   Social  History Main Topics  . Smoking status: Former Smoker    Quit date: 05/08/2014  . Smokeless tobacco: Never Used  . Alcohol Use: 1.8 oz/week    3 Glasses of wine per week  . Drug Use: Not on file  . Sexual Activity: Not on file   Other Topics Concern  . Not on file   Social History Narrative  . No narrative on file    Review of Systems: Gen: Denies any fever, chills, sweats, anorexia, fatigue, weakness, malaise, weight loss, and sleep disorder HEENT: No visual complaints, No history of Retinopathy. Normal external appearance No Epistaxis or Sore throat. No sinusitis.   CV: Denies chest pain, angina, palpitations, syncope, orthopnea, PND, peripheral edema, and claudication. Resp: Denies dyspnea at rest, dyspnea with exercise, cough, sputum, wheezing, coughing up blood, and pleurisy. GI: Denies vomiting blood, jaundice, and fecal incontinence.   Denies dysphagia or odynophagia. GU : Denies urinary burning, blood in urine, urinary frequency, urinary hesitancy, nocturnal urination, and urinary incontinence.  No renal calculi. MS: Denies joint pain, limitation of movement, and swelling, stiffness, low back pain, extremity pain. Denies muscle weakness, cramps, atrophy.  No use of non steroidal antiinflammatory drugs. Derm: Denies rash, itching, dry skin, hives, moles, warts, or unhealing ulcers.  Psych: Denies depression, anxiety, memory loss, suicidal ideation, hallucinations, paranoia, and confusion. Heme: Denies bruising, bleeding, and enlarged lymph nodes. Neuro: No headache.  No diplopia. No dysarthria.  No dysphasia.  No history of CVA.  No Seizures. No paresthesias.  No weakness. Endocrine No DM.  No Thyroid disease.  No Adrenal disease.  Physical Exam: Vital signs in last 24 hours: Temp:  [98.3 F (36.8 C)-98.8 F (37.1 C)] 98.3 F (36.8 C) (12/24 0450) Pulse Rate:  [77-83] 79 (12/24 0450) Resp:  [14-18] 16 (12/24 0450) BP: (103-127)/(48-90) 117/90 mmHg (12/24 0450) SpO2:  [98  %-99 %] 98 % (12/24 0450) Weight:  [72.8 kg (160 lb 7.9 oz)] 72.8 kg (160 lb 7.9 oz) (12/24 0450) Last BM Date: 05/09/15 General:   Alert,  Well-developed, well-nourished, pleasant and cooperative in NAD Head:  Normocephalic and atraumatic. Eyes:  Sclera clear, no icterus.   Conjunctiva pink. Ears:  Normal auditory acuity. Nose:  No deformity, discharge,  or lesions. Mouth:  No deformity or lesions, dentition normal. Neck:  Supple; no masses or thyromegaly. JVP not elevated Lungs:  Clear throughout to auscultation.   No wheezes, crackles, or rhonchi. No acute distress. Heart:  Regular rate and rhythm; no murmurs, clicks, rubs,  or gallops. Abdomen:  Soft, nontender and nondistended. No masses, hepatosplenomegaly or hernias noted.  Normal bowel sounds, without guarding, and without rebound.  Foley Catheter --- pinkish urine Msk:  Symmetrical without gross deformities. Normal posture. Pulses:  No carotid, renal, femoral bruits. DP and PT symmetrical and equal Extremities:  Without clubbing or edema. Neurologic:  Alert and  oriented x4;  grossly normal neurologically. Skin:  Intact without significant lesions or rashes. Cervical Nodes:  No significant cervical adenopathy. Psych:  Alert and cooperative. Normal mood and affect.  Intake/Output from previous day: 12/23 0701 - 12/24 0700 In: 2870 [P.O.:120; I.V.:1200; IV Piggyback:50] Out: [id number] [Urine:4225] Intake/Output this shift: Total I/O In: 240 [P.O.:240] Out: -   Lab Results:  Recent Labs  05/10/15 0646 05/10/15 1250 05/11/15 0518  WBC 8.8 9.2 8.4  HGB 7.4* 7.4* 7.1*  HCT 22.4* 22.0* 21.0*  PLT 308 315 302   BMET  Recent Labs  05/09/15 1712 05/10/15 0130 05/11/15 0518  NA 142 142 142  K 3.8 4.0 4.2  CL 111 113* 114*  CO2  --  21* 18*  GLUCOSE 102* 111* 97  BUN 41* 45* 42*  CREATININE 4.70* 4.95* 5.08*  CALCIUM  --  8.6* 8.4*   LFT No results for input(s): PROT, ALBUMIN, AST, ALT, ALKPHOS, BILITOT, BILIDIR,  IBILI in the last 72 hours. PT/INR  Recent Labs  05/09/15 1952  LABPROT 15.2  INR 1.19   Hepatitis Panel No results for input(s): HEPBSAG, HCVAB, HEPAIGM, HEPBIGM in the last 72 hours.  Studies/Results: Dg Chest 2 View  05/09/2015  CLINICAL DATA:  Gross hematuria and unable to urinate. EXAM: CHEST  2 VIEW COMPARISON:  05/09/2015 FINDINGS: Heart and mediastinum are within normal limits. Trachea is midline. Both lungs are clear without airspace disease or pulmonary edema. No large pleural effusions. Degenerative change along the right side of the thoracic spine. IMPRESSION: No active cardiopulmonary disease. Electronically Signed   By: Markus Daft M.D.   On: 05/09/2015 18:18    Assessment/Plan:  Acute Kidney Injury ---  Appears to have interstitial nephritis on biopsy and is followed by  Dr Theador Hawthorne. Was contemplating steroids although patient has Hep C and was waiting on GI recommendations  Hematuria -- secondary to renal biopsy                 Foley irrigation                Continue to monitor renal function  We shall await the evaluation by GI before starting steroids   LOS: 2 Ebrima Ranta W @TODAY @10 :35 AM

## 2015-05-11 NOTE — Progress Notes (Signed)
Irrigated foley catheter with NS.  Pulled back multiple large clots. Will continue with CBI per order.

## 2015-05-11 NOTE — Progress Notes (Signed)
  Subjective: Patient reports clearing of urine. No clots in tube.  Hx reviewed. Discussed with Dr. Olen Pel and RN and patient and husband.  Note: pt had renal bx at [facility] for renal failure ( unknown Cr) with hx of sarcoid and hep C, with immediate gross hematuria, but no evaluation or referral for q week. Now with increasing Cr and decreasing Hgb.  Pt awake and alert, and wants d/c b/c of Christmas eve.   Objective: Vital signs in last 24 hours: Temp:  [98.3 F (36.8 C)-98.8 F (37.1 C)] 98.3 F (36.8 C) (12/24 0450) Pulse Rate:  [77-83] 79 (12/24 0450) Resp:  [14-18] 16 (12/24 0450) BP: (103-127)/(48-90) 117/90 mmHg (12/24 0450) SpO2:  [98 %-99 %] 98 % (12/24 0450) Weight:  [72.8 kg (160 lb 7.9 oz)] 72.8 kg (160 lb 7.9 oz) (12/24 0450)A  Intake/Output from previous day: 12/23 0701 - 12/24 0700 In: 2870 [P.O.:120; I.V.:1200; IV Piggyback:50] Out: [id number] [Urine:4225] Intake/Output this shift:    Past Medical History  Diagnosis Date  . Hyperlipidemia   . Chronic kidney disease   . Anemia   . Hepatitis     Physical Exam:  Lungs - Normal respiratory effort, chest expands symmetrically.  Abdomen - Soft, non-tender & non-distended. Bladder: non-distended Lab Results:  Recent Labs  05/10/15 0646 05/10/15 1250 05/11/15 0518  WBC 8.8 9.2 8.4  HGB 7.4* 7.4* 7.1*  HCT 22.4* 22.0* 21.0*   BMET  Recent Labs  05/10/15 0130 05/11/15 0518  NA 142 142  K 4.0 4.2  CL 113* 114*  CO2 21* 18*  GLUCOSE 111* 97  BUN 45* 42*  CREATININE 4.95* 5.08*  CALCIUM 8.6* 8.4*   No results for input(s): LABURIN in the last 72 hours. No results found for this or any previous visit.  Studies/Results: No results found.  Assessment:  Discussed with Dr. Olen Pel. Will get Nephrology consult. [facility] from [facility] is not available for evaluation.  Plan: Urology to follow.  Salahuddin Arismendez I Aaliya Maultsby 05/11/2015, 9:24 AM

## 2015-05-12 LAB — BASIC METABOLIC PANEL
ANION GAP: 11 (ref 5–15)
BUN: 41 mg/dL — ABNORMAL HIGH (ref 6–20)
CALCIUM: 8.8 mg/dL — AB (ref 8.9–10.3)
CHLORIDE: 115 mmol/L — AB (ref 101–111)
CO2: 17 mmol/L — AB (ref 22–32)
CREATININE: 4.4 mg/dL — AB (ref 0.44–1.00)
GFR calc non Af Amer: 10 mL/min — ABNORMAL LOW (ref >60)
GFR, EST AFRICAN AMERICAN: 11 mL/min — AB (ref >60)
Glucose, Bld: 203 mg/dL — ABNORMAL HIGH (ref 65–99)
Potassium: 4.8 mmol/L (ref 3.5–5.1)
SODIUM: 143 mmol/L (ref 135–145)

## 2015-05-12 LAB — CBC
HEMATOCRIT: 21.4 % — AB (ref 36.0–46.0)
HEMOGLOBIN: 7.2 g/dL — AB (ref 12.0–15.0)
MCH: 30.5 pg (ref 26.0–34.0)
MCHC: 33.6 g/dL (ref 30.0–36.0)
MCV: 90.7 fL (ref 78.0–100.0)
Platelets: 309 10*3/uL (ref 150–400)
RBC: 2.36 MIL/uL — ABNORMAL LOW (ref 3.87–5.11)
RDW: 14.2 % (ref 11.5–15.5)
WBC: 8.8 10*3/uL (ref 4.0–10.5)

## 2015-05-12 MED ORDER — SODIUM CHLORIDE 0.9 % IV SOLN
250.0000 mg | Freq: Every day | INTRAVENOUS | Status: AC
  Administered 2015-05-12 – 2015-05-13 (×2): 250 mg via INTRAVENOUS
  Filled 2015-05-12 (×3): qty 4

## 2015-05-12 NOTE — Progress Notes (Signed)
[facility] ROUNDING NOTE   Subjective:   Interval History: no complaints today  Received 2 doses of IV solumedrol  Objective:  Vital signs in last 24 hours:  Temp:  [97.4 F (36.3 C)-98.2 F (36.8 C)] 97.4 F (36.3 C) (12/25 1411) Pulse Rate:  [73-86] 74 (12/25 1411) Resp:  [16] 16 (12/25 1411) BP: (108-136)/(51-56) 136/56 mmHg (12/25 1411) SpO2:  [97 %-100 %] 100 % (12/25 1411) Weight:  [72.576 kg (160 lb)] 72.576 kg (160 lb) (12/25 0508)  Weight change: -0.225 kg (-7.9 oz) Filed Weights   05/10/15 0437 05/11/15 0450 05/12/15 0508  Weight: 72 kg (158 lb 11.7 oz) 72.8 kg (160 lb 7.9 oz) 72.576 kg (160 lb)    Intake/Output: I/O last 3 completed shifts: In: 3762 [P.O.:360; I.V.:1800; Other:1500; IV Piggyback:102] Out: 6100 [Urine:6100]   Intake/Output this shift:  Total I/O In: 240 [P.O.:240] Out: 775 [Urine:775]  CVS- RRR RS- CTA ABD- BS present soft non-distended EXT- no edema   Basic Metabolic Panel:  Recent Labs Lab 05/09/15 1712 05/10/15 0130 05/11/15 0518 05/12/15 0514  NA 142 142 142 143  K 3.8 4.0 4.2 4.8  CL 111 113* 114* 115*  CO2  --  21* 18* 17*  GLUCOSE 102* 111* 97 203*  BUN 41* 45* 42* 41*  CREATININE 4.70* 4.95* 5.08* 4.40*  CALCIUM  --  8.6* 8.4* 8.8*    Liver Function Tests: No results for input(s): AST, ALT, ALKPHOS, BILITOT, PROT, ALBUMIN in the last 168 hours. No results for input(s): LIPASE, AMYLASE in the last 168 hours. No results for input(s): AMMONIA in the last 168 hours.  CBC:  Recent Labs Lab 05/09/15 2019 05/10/15 0130 05/10/15 0646 05/10/15 1250 05/11/15 0518 05/12/15 0514  WBC 10.1 9.1 8.8 9.2 8.4 8.8  NEUTROABS 7.6  --   --   --   --   --   HGB 7.2* 7.4* 7.4* 7.4* 7.1* 7.2*  HCT 21.8* 22.3* 22.4* 22.0* 21.0* 21.4*  MCV 92.0 92.5 91.8 90.9 91.3 90.7  PLT 312 313 308 315 302 309    Cardiac Enzymes: No results for input(s): CKTOTAL, CKMB, CKMBINDEX, TROPONINI in the last 168  hours.  BNP: Invalid input(s): POCBNP  CBG: No results for input(s): GLUCAP in the last 168 hours.  Microbiology: Results for orders placed or performed during the hospital encounter of 05/09/15  Culture, Urine     Status: None   Collection Time: 05/10/15  6:17 AM  Result Value Ref Range Status   Specimen Description URINE, CATHETERIZED  Final   Special Requests NONE  Final   Culture   Final    NO GROWTH 1 DAY Performed at [facility]    Report Status 05/11/2015 FINAL  Final    Coagulation Studies:  Recent Labs  05/09/15 1952  LABPROT 15.2  INR 1.19    Urinalysis: No results for input(s): COLORURINE, LABSPEC, PHURINE, GLUCOSEU, HGBUR, BILIRUBINUR, KETONESUR, PROTEINUR, UROBILINOGEN, NITRITE, LEUKOCYTESUR in the last 72 hours.  Invalid input(s): APPERANCEUR    Imaging: No results found.   Medications:     . sodium chloride   Intravenous Once  . cefTRIAXone (ROCEPHIN)  IV  1 g Intravenous Q24H  . methylPREDNISolone (SOLU-MEDROL) injection  250 mg Intravenous Daily  . pantoprazole  20 mg Oral Daily  . sodium chloride  3 mL Intravenous Q12H   acetaminophen **OR** acetaminophen, ondansetron (ZOFRAN) IV, polyethylene glycol, tetrahydrozoline  Assessment/ Plan:   Acute Kidney Injury --- Appears to have interstitial nephritis on  biopsy and is followed by Dr Theador Hawthorne. Was contemplating steroids although patient has Hep C and was waiting on GI recommendations  Hematuria -- secondary to renal biopsy  Foley irrigation  Continue to monitor renal function  GI have permitted steroid use  Continue for 3 days -- solumedrol   Convert to oral pills  Day 4   60mg  daily and taper  ( side effect of steroids explained )    LOS: 3 Autym Siess W @TODAY @2 :27 PM

## 2015-05-12 NOTE — Progress Notes (Signed)
Patient ID: Kristen Mcknight, female   DOB: [birth year], 64 y.o.   MRN: [id number]  Subjective: Patient reports having no new flank pain. Her catheter remains indwelling but she has not had difficulty with clots. The CBI has been reduced to the point where it is nearly not dripping at all and she continues to do fairly well with the catheter draining.  Objective: Vital signs in last 24 hours: Temp:  [97.6 F (36.4 C)-98.6 F (37 C)] 97.6 F (36.4 C) (12/25 0508) Pulse Rate:  [73-86] 73 (12/25 0508) Resp:  [16] 16 (12/25 0508) BP: (108-114)/(51-58) 112/56 mmHg (12/25 0508) SpO2:  [97 %-100 %] 98 % (12/25 0508) Weight:  [72.576 kg (160 lb)] 72.576 kg (160 lb) (12/25 0508)A  Intake/Output from previous day: 12/24 0701 - 12/25 0700 In: 2542 [P.O.:240; I.V.:1000; IV Piggyback:102] Out: 4200 [Urine:4200] Intake/Output this shift: Total I/O In: 240 [P.O.:240] Out: 425 [Urine:425]  Past Medical History  Diagnosis Date  . Hyperlipidemia   . Chronic kidney disease   . Anemia   . Hepatitis     Physical Exam:  Lungs - Normal respiratory effort, chest expands symmetrically.  Abdomen - Soft, non-tender & non-distended.  Lab Results:  Recent Labs  05/10/15 1250 05/11/15 0518 05/12/15 0514  WBC 9.2 8.4 8.8  HGB 7.4* 7.1* 7.2*  HCT 22.0* 21.0* 21.4*   BMET  Recent Labs  05/11/15 0518 05/12/15 0514  NA 142 143  K 4.2 4.8  CL 114* 115*  CO2 18* 17*  GLUCOSE 97 203*  BUN 42* 41*  CREATININE 5.08* 4.40*  CALCIUM 8.4* 8.8*   No results for input(s): LABURIN in the last 72 hours. Results for orders placed or performed during the hospital encounter of 05/09/15  Culture, Urine     Status: None   Collection Time: 05/10/15  6:17 AM  Result Value Ref Range Status   Specimen Description URINE, CATHETERIZED  Final   Special Requests NONE  Final   Culture   Final    NO GROWTH 1 DAY Performed at [facility]    Report Status 05/11/2015 FINAL  Final     Studies/Results: No results found.  Assessment: She appears to be doing well with essentially no CBI running. We discussed the options which would be to remove the catheter and see if she is able to void spontaneously. She does understand there is a possibility that if a clot forms and she is unable to urinate the catheter would need to be reinserted but she would like to see how she does without a catheter today. I have encouraged her to drink plenty of fluids.  Plan: 1. DC Foley catheter. 2. Reinsert Foley if patient unable to urinate/redeveloped clot retention.  Juliani Laduke C 05/12/2015, 11:34 AM

## 2015-05-12 NOTE — Progress Notes (Signed)
Patient ID: Kristen Mcknight, female   DOB: 07/01/1950, 64 y.o.   MRN: [id number]  [facility] PROGRESS NOTE  Kristen Mcknight [id number] DOB: 07/04/1950 DOA: 05/09/2015 PCP: Manon Hilding, MD   Brief narrative:    64 y.o. female with PMH of sarcoidosis in remission, anemia, chronic hepatitis C, hyperlipidemia, and recent diagnosis of acute interstitial nephritis who presented in transfer from [facility] with gross hematuria, dysuria, and suprapubic and flank pain after kidney biopsy one week ago. Pt was in her usual state until approximately 4 weeks ago when she developed a metallic taste in her mouth. She saw her PCP for this and subsequent blood work revealed acute renal failure. She was referred to the [facility], where she had a kidney biopsy one week ago and was diagnosed with acute interstitial nephritis. Following the biopsy, there was gross hematuria initially but this cleared after 2 days and she was discharged home. She was doing quite well at home until gross hematuria returned associated with nausea, vomiting, suprapubic, and flank pain. She was evaluated at [facility] ED, where she was found to be anemic with hemoglobin of 8 and CT abdomen/pelvis demonstrated a large clot burden in the urinary bladder. Foley catheter was placed and CBI initiated. Urologist, Dr. Alyson Ingles, has kindly consulted on the case and recommends hospital admission with CBI. The consultant has placed the patient on Rocephin.  Assessment/Plan:    Principal Problem:   Pilar Plate hematuria - gross hematuria and clot retention - 24 French foley catehter was placed and 100cc of clot was evacuated from the bladder - bedside US confirmed no residual clot. The foley was then exchanged for a 75 French 3 way catheter and CBI was started - appreciate urology team assistance   Active Problems:   Acute renal failure (ARF) ([place]) - AIN per renal biopsy  - appreciate Dr. Jason Nest assistance, GI team said it  was OK to start steroids in pt with known hep C - continue with solumedrol 250 mg IV QD day #2/3 and then transition to oral Prednisone  - Cr is trending down  - continue Rocephin day #4    Acute blood loss anemia - secondary to principal problem - Hg stable in the past 24 hours, no further drops, pt prefers to hold on transfusion and I agree with her  - CBC in AM and transfuse for Hg <7    Hep C w/o coma, chronic ([place]) - stable for now    Sarcoidosis ([place]) - outpatient follow up  DVT prophylaxis - SCD's  Code Status: Full.  Family Communication:  plan of care discussed with the patient Disposition Plan: Home when cleared by urology and nephrology   IV access:  Peripheral IV  Procedures and diagnostic studies:    Dg Chest 2 View 05/09/2015  No active cardiopulmonary disease.   Medical Consultants:  Urology  Nephrology GI  Other Consultants:  None   IAnti-Infectives:   Rocephin 12/22 -->  Faye Ramsay, MD  [facility] Pager [phone]  If 7PM-7AM, please contact night-coverage [email or web address] Password [email or web address] 05/12/2015, 10:01 AM   LOS: 3 days   HPI/Subjective: No events overnight.   Objective: Filed Vitals:   05/11/15 0450 05/11/15 1403 05/11/15 2153 05/12/15 0508  BP: 117/90 114/58 108/51 112/56  Pulse: 79 79 86 73  Temp: 98.3 F (36.8 C) 98.6 F (37 C) 98.2 F (36.8 C) 97.6 F (36.4 C)  TempSrc: Oral Oral Oral Oral  Resp: 16 16 16 16   Height:  Weight: 72.8 kg (160 lb 7.9 oz)   72.576 kg (160 lb)  SpO2: 98% 100% 97% 98%    Intake/Output Summary (Last 24 hours) at 05/12/15 1001 Last data filed at 05/12/15 [id number]  Gross per 24 hour  Intake   2302 ml  Output   4625 ml  Net  -2323 ml    Exam:   General:  Pt is alert, follows commands appropriately, not in acute distress  Cardiovascular: Regular rate and rhythm, S1/S2, no murmurs, no rubs, no gallops  Respiratory: Clear to auscultation bilaterally, no wheezing, no crackles, no  rhonchi  Abdomen: Soft, non tender, non distended, bowel sounds present, no guarding   Data Reviewed: Basic Metabolic Panel:  Recent Labs Lab 05/09/15 1712 05/10/15 0130 05/11/15 0518 05/12/15 0514  NA 142 142 142 143  K 3.8 4.0 4.2 4.8  CL 111 113* 114* 115*  CO2  --  21* 18* 17*  GLUCOSE 102* 111* 97 203*  BUN 41* 45* 42* 41*  CREATININE 4.70* 4.95* 5.08* 4.40*  CALCIUM  --  8.6* 8.4* 8.8*    CBC:  Recent Labs Lab 05/09/15 2019 05/10/15 0130 05/10/15 0646 05/10/15 1250 05/11/15 0518 05/12/15 0514  WBC 10.1 9.1 8.8 9.2 8.4 8.8  NEUTROABS 7.6  --   --   --   --   --   HGB 7.2* 7.4* 7.4* 7.4* 7.1* 7.2*  HCT 21.8* 22.3* 22.4* 22.0* 21.0* 21.4*  MCV 92.0 92.5 91.8 90.9 91.3 90.7  PLT 312 313 308 315 302 309   Scheduled Meds: . sodium chloride   Intravenous Once  . cefTRIAXone (ROCEPHIN)  IV  1 g Intravenous Q24H  . methylPREDNISolone (SOLU-MEDROL) injection  250 mg Intravenous Daily  . pantoprazole  20 mg Oral Daily  . sodium chloride  3 mL Intravenous Q12H   Continuous Infusions: . sodium chloride 50 mL/hr at 05/11/15 1416

## 2015-05-13 LAB — CBC
HCT: 21.4 % — ABNORMAL LOW (ref 36.0–46.0)
HEMOGLOBIN: 7.1 g/dL — AB (ref 12.0–15.0)
MCH: 30.6 pg (ref 26.0–34.0)
MCHC: 33.2 g/dL (ref 30.0–36.0)
MCV: 92.2 fL (ref 78.0–100.0)
PLATELETS: 349 10*3/uL (ref 150–400)
RBC: 2.32 MIL/uL — AB (ref 3.87–5.11)
RDW: 14.4 % (ref 11.5–15.5)
WBC: 19.4 10*3/uL — AB (ref 4.0–10.5)

## 2015-05-13 LAB — TYPE AND SCREEN
ABO/RH(D): O POS
ANTIBODY SCREEN: NEGATIVE
UNIT DIVISION: 0
Unit division: 0

## 2015-05-13 LAB — BASIC METABOLIC PANEL
Anion gap: 10 (ref 5–15)
BUN: 46 mg/dL — ABNORMAL HIGH (ref 6–20)
CALCIUM: 8.6 mg/dL — AB (ref 8.9–10.3)
CO2: 17 mmol/L — ABNORMAL LOW (ref 22–32)
Chloride: 114 mmol/L — ABNORMAL HIGH (ref 101–111)
Creatinine, Ser: 4.4 mg/dL — ABNORMAL HIGH (ref 0.44–1.00)
GFR, EST AFRICAN AMERICAN: 11 mL/min — AB (ref >60)
GFR, EST NON AFRICAN AMERICAN: 10 mL/min — AB (ref >60)
Glucose, Bld: 251 mg/dL — ABNORMAL HIGH (ref 65–99)
POTASSIUM: 4 mmol/L (ref 3.5–5.1)
SODIUM: 141 mmol/L (ref 135–145)

## 2015-05-13 MED ORDER — PREDNISONE 10 MG PO TABS: ORAL_TABLET | ORAL | Status: DC

## 2015-05-13 NOTE — Consult Note (Signed)
   Subjective: Patient reports she passed clots after her catheter was removed and since that time has not passed anymore clots in her urine has now completely cleared.  Objective: Vital signs in last 24 hours: Temp:  [97.4 F (36.3 C)-98 F (36.7 C)] 98 F (36.7 C) (12/26 0553) Pulse Rate:  [66-74] 68 (12/26 0553) Resp:  [16] 16 (12/26 0553) BP: (114-136)/(50-62) 114/50 mmHg (12/26 0553) SpO2:  [98 %-100 %] 98 % (12/26 0553) Weight:  [73.936 kg (163 lb)] 73.936 kg (163 lb) (12/26 0553)A  Intake/Output from previous day: 12/25 0701 - 12/26 0700 In: [id number] [P.O.:720; IV Piggyback:104] Out: [id number] [Urine:3175] Intake/Output this shift:    Past Medical History  Diagnosis Date  . Hyperlipidemia   . Chronic kidney disease   . Anemia   . Hepatitis     Physical Exam:  Lungs - Normal respiratory effort, chest expands symmetrically.  Abdomen - Soft, non-tender & non-distended.  Lab Results:  Recent Labs  05/11/15 0518 05/12/15 0514 05/13/15 0423  WBC 8.4 8.8 19.4*  HGB 7.1* 7.2* 7.1*  HCT 21.0* 21.4* 21.4*   BMET  Recent Labs  05/12/15 0514 05/13/15 0423  NA 143 141  K 4.8 4.0  CL 115* 114*  CO2 17* 17*  GLUCOSE 203* 251*  BUN 41* 46*  CREATININE 4.40* 4.40*  CALCIUM 8.8* 8.6*   No results for input(s): LABURIN in the last 72 hours. Results for orders placed or performed during the hospital encounter of 05/09/15  Culture, Urine     Status: None   Collection Time: 05/10/15  6:17 AM  Result Value Ref Range Status   Specimen Description URINE, CATHETERIZED  Final   Special Requests NONE  Final   Culture   Final    NO GROWTH 1 DAY Performed at [facility]    Report Status 05/11/2015 FINAL  Final    Studies/Results: No results found.  Assessment: Her hematuria has resolved, her catheter is out.  Plan: Please contact us if further urologic assistance is needed.  Sherlynn Tourville C 05/13/2015, 7:30 AM

## 2015-05-13 NOTE — Progress Notes (Signed)
  [facility] Progress Note   Subjective: alert, no distress  Filed Vitals:   05/12/15 0508 05/12/15 1411 05/12/15 2225 05/13/15 0553  BP: 112/56 136/56 120/62 114/50  Pulse: 73 74 66 68  Temp: 97.6 F (36.4 C) 97.4 F (36.3 C) 97.8 F (36.6 C) 98 F (36.7 C)  TempSrc: Oral Oral Oral Oral  Resp: 16 16 16 16   Height:      Weight: 72.576 kg (160 lb)   73.936 kg (163 lb)  SpO2: 98% 100% 99% 98%    Inpatient medications: . sodium chloride   Intravenous Once  . cefTRIAXone (ROCEPHIN)  IV  1 g Intravenous Q24H  . pantoprazole  20 mg Oral Daily  . 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 Stable creat 2 Hematuria w bladder clot, post-bx - resolved 3 Hep C 4 Hx sarcoid  Plan - for dc today. Will f/u Dr Theador Hawthorne this week. Should be dc'd on pred 60 mg /d until renal f/u.    Kelly Splinter MD [facility] pager [phone]    cell [phone] 05/13/2015, 11:39 AM    Recent Labs Lab 05/11/15 0518 05/12/15 0514 05/13/15 0423  NA 142 143 141  K 4.2 4.8 4.0  CL 114* 115* 114*  CO2 18* 17* 17*  GLUCOSE 97 203* 251*  BUN 42* 41* 46*  CREATININE 5.08* 4.40* 4.40*  CALCIUM 8.4* 8.8* 8.6*   No results for input(s): AST, ALT, ALKPHOS, BILITOT, PROT, ALBUMIN in the last 168 hours.  Recent Labs Lab 05/09/15 2019  05/11/15 0518 05/12/15 0514 05/13/15 0423  WBC 10.1  < > 8.4 8.8 19.4*  NEUTROABS 7.6  --   --   --   --   HGB 7.2*  < > 7.1* 7.2* 7.1*  HCT 21.8*  < > 21.0* 21.4* 21.4*  MCV 92.0  < > 91.3 90.7 92.2  PLT 312  < > 302 309 349  < > = values in this interval not displayed.   ]

## 2015-05-13 NOTE — Discharge Summary (Signed)
Physician Discharge Summary  Kristen Mcknight [id number] DOB: 1950/09/20 DOA: 05/09/2015  PCP: Manon Hilding, MD  Admit date: 05/09/2015 Discharge date: 05/13/2015  Recommendations for Outpatient Follow-up:  1. Pt will need to follow up with PCP in 1 weeks post discharge 2. Please obtain BMP to evaluate electrolytes and kidney function 3. Please also check CBC to evaluate Hg and Hct levels 4. Pt discharged on Prednisone 60 mg PO daily and was advised to see her nephrologist to recheck BMP and decide on further tapering   Discharge Diagnoses:  Principal Problem:   Pilar Plate hematuria Active Problems:   Acute renal failure (ARF) ([place])   Acute interstitial nephritis   Acute blood loss anemia   Hep C w/o coma, chronic (HCC)   Sarcoidosis (HCC)   HLD (hyperlipidemia)   Discharge Condition: Stable  Diet recommendation: Heart healthy diet discussed in details   Brief narrative:    64 y.o. female with PMH of sarcoidosis in remission, anemia, chronic hepatitis C, hyperlipidemia, and recent diagnosis of acute interstitial nephritis who presented in transfer from [facility] with gross hematuria, dysuria, and suprapubic and flank pain after kidney biopsy one week ago. Pt was in her usual state until approximately 4 weeks ago when she developed a metallic taste in her mouth. She saw her PCP for this and subsequent blood work revealed acute renal failure. She was referred to the [facility], where she had a kidney biopsy one week ago and was diagnosed with acute interstitial nephritis. Following the biopsy, there was gross hematuria initially but this cleared after 2 days and she was discharged home. She was doing quite well at home until gross hematuria returned associated with nausea, vomiting, suprapubic, and flank pain. She was evaluated at [facility] ED, where she was found to be anemic with hemoglobin of 8 and CT abdomen/pelvis demonstrated a large clot burden in the urinary  bladder. Foley catheter was placed and CBI initiated. Urologist, Dr. Alyson Ingles, has kindly consulted on the case and recommends hospital admission with CBI. The consultant has placed the patient on Rocephin.  Assessment/Plan:    Principal Problem:  Pilar Plate hematuria - gross hematuria and clot retention - 24 French foley catehter was placed and 100cc of clot was evacuated from the bladder - bedside US confirmed no residual clot. The foley was then exchanged for a 20 [place] 3 way catheter and CBI was started - improved, CBI completed and no further bleeding per pt   Active Problems:  Acute renal failure (ARF) (HCC) - AIN per renal biopsy  - appreciate Dr. Jason Nest assistance, GI team said it was OK to start steroids in pt with known hep C - completed three days of solumedrol and transitioned to oral Prednisone 60 mg PO QD until seen by her nephrologist    Acute blood loss anemia - secondary to principal problem - Hg stable in the past 24 hours, no further drops, pt prefers to hold on transfusion and I agree with her    Hep C w/o coma, chronic ([place]) - stable for now   Sarcoidosis [facility]) - outpatient follow up  Code Status: Full.  Family Communication: plan of care discussed with the patient Disposition Plan: Home   IV access:  Peripheral IV  Procedures and diagnostic studies:   Dg Chest 2 View 05/09/2015 No active cardiopulmonary disease.   Medical Consultants:  Urology  Nephrology GI  Other Consultants:  None   IAnti-Infectives:   Rocephin 12/22 --> no need to continue upon discharge,  urine culture negative          Discharge Exam: Filed Vitals:   05/12/15 2225 05/13/15 0553  BP: 120/62 114/50  Pulse: 66 68  Temp: 97.8 F (36.6 C) 98 F (36.7 C)  Resp: 16 16   Filed Vitals:   05/12/15 0508 05/12/15 1411 05/12/15 2225 05/13/15 0553  BP: 112/56 136/56 120/62 114/50  Pulse: 73 74 66 68  Temp: 97.6 F (36.4 C) 97.4 F (36.3 C) 97.8  F (36.6 C) 98 F (36.7 C)  TempSrc: Oral Oral Oral Oral  Resp: 16 16 16 16   Height:      Weight: 72.576 kg (160 lb)   73.936 kg (163 lb)  SpO2: 98% 100% 99% 98%    General: Pt is alert, follows commands appropriately, not in acute distress Cardiovascular: Regular rate and rhythm, S1/S2 +, no murmurs, no rubs, no gallops Respiratory: Clear to auscultation bilaterally, no wheezing, no crackles, no rhonchi Abdominal: Soft, non tender, non distended, bowel sounds +, no guarding Extremities: no edema, no cyanosis, pulses palpable bilaterally DP and PT Neuro: Grossly nonfocal  Discharge Instructions  Discharge Instructions    Diet - low sodium heart healthy    Complete by:  As directed      Increase activity slowly    Complete by:  As directed             Medication List    STOP taking these medications        naproxen sodium 220 MG tablet  Commonly known as:  ANAPROX      TAKE these medications        fluticasone 50 MCG/ACT nasal spray  Commonly known as:  FLONASE  Place 2 sprays into both nostrils daily as needed for allergies or rhinitis.     loratadine 10 MG tablet  Commonly known as:  CLARITIN  Take 10 mg by mouth daily as needed for allergies.     nitroGLYCERIN 0.4 MG SL tablet  Commonly known as:  NITROSTAT  Place 0.4 mg under the tongue every 5 (five) minutes as needed for chest pain.     pantoprazole 20 MG tablet  Commonly known as:  PROTONIX  Take 20 mg by mouth daily.     predniSONE 10 MG tablet  Commonly known as:  DELTASONE  Take 50 mg tablet     simvastatin 40 MG tablet  Commonly known as:  ZOCOR  Take 40 mg by mouth daily.     VISINE OP  Apply 1 drop to eye daily as needed (dry eyes).           Follow-up Information    Follow up with Manon Hilding, MD.   Specialty:  Family Medicine   Contact information:   [place] [address] [phone]        The results of significant diagnostics from this hospitalization  (including imaging, microbiology, ancillary and laboratory) are listed below for reference.     Microbiology: Recent Results (from the past 240 hour(s))  Culture, Urine     Status: None   Collection Time: 05/10/15  6:17 AM  Result Value Ref Range Status   Specimen Description URINE, CATHETERIZED  Final   Special Requests NONE  Final   Culture   Final    NO GROWTH 1 DAY Performed at [facility]    Report Status 05/11/2015 FINAL  Final     Labs: Basic Metabolic Panel:  Recent Labs Lab 05/09/15 1712 05/10/15  0130 05/11/15 0518 05/12/15 0514 05/13/15 0423  NA 142 142 142 143 141  K 3.8 4.0 4.2 4.8 4.0  CL 111 113* 114* 115* 114*  CO2  --  21* 18* 17* 17*  GLUCOSE 102* 111* 97 203* 251*  BUN 41* 45* 42* 41* 46*  CREATININE 4.70* 4.95* 5.08* 4.40* 4.40*  CALCIUM  --  8.6* 8.4* 8.8* 8.6*   CBC:  Recent Labs Lab 05/09/15 2019  05/10/15 0646 05/10/15 1250 05/11/15 0518 05/12/15 0514 05/13/15 0423  WBC 10.1  < > 8.8 9.2 8.4 8.8 19.4*  NEUTROABS 7.6  --   --   --   --   --   --   HGB 7.2*  < > 7.4* 7.4* 7.1* 7.2* 7.1*  HCT 21.8*  < > 22.4* 22.0* 21.0* 21.4* 21.4*  MCV 92.0  < > 91.8 90.9 91.3 90.7 92.2  PLT 312  < > 308 315 302 309 349  < > = values in this interval not displayed.   SIGNED: Time coordinating discharge:  30 minutes  Faye Ramsay, MD  [facility] 05/13/2015, 8:43 AM Pager [phone]  If 7PM-7AM, please contact night-coverage [email or web address] Password [email or web address]

## 2015-05-13 NOTE — Discharge Instructions (Signed)
Kidney Biopsy A biopsy is a test that involves collecting small pieces of tissue, usually with a needle. The tissue is then examined under a microscope. A kidney biopsy can help a health care provider make a diagnosis and determine the best course of treatment. Your health care provider may recommend a kidney biopsy if you have any of the following conditions:  Blood in your urine (hematuria).  Excessive protein in your urine (proteinuria).  Impaired kidney function that causes excessive waste products in your blood. A specialist will look at the kidney tissue samples to check for unusual deposits, scarring, or infecting organisms that would explain your condition. If you have a kidney transplant, a biopsy can also help explain why a transplanted kidney is not working properly. Talk with your health care provider about what information might be learned from the biopsy and the risks involved. This can help you make a decision about whether a biopsy is worthwhile in your case. LET [facility] CARE PROVIDER KNOW ABOUT:  Any allergies you have.  All medicines you are taking, including vitamins, herbs, eye drops, creams, and over-the-counter medicines.  Previous problems you or members of your family have had with the use of anesthetics.  Any blood disorders you have.  Previous surgeries you have had.  Medical conditions you have. RISKS AND COMPLICATIONS Generally, a kidney biopsy is a safe procedure. However, as with any procedure, complications can occur. Possible complications include:  Infection.  Bleeding. BEFORE THE PROCEDURE  Make sure you understand the need for a biopsy.  Do not eat or drink for 8 hours before the test or as directed by your health care provider.  You will need to give blood and urine samples before the biopsy. This is to make sure you do not have a condition where you should not have a biopsy. PROCEDURE Kidney biopsies are usually done in a hospital. During  the procedure, you may be fully awake with light sedation, or you may be asleep under general anesthesia. The entire procedure usually takes an hour.  You will lie on your stomach to position the kidneys near the surface of your back. If you have a transplanted kidney, you will lie on your back.  The health care provider will inject a local painkiller. For a through-the-skin (percutaneous) biopsy, the health care provider will use a locating needle and X-ray or ultrasound equipment to find the right spot.  A collecting needle will be used to gather the tissue. If you are awake, you will be asked to hold your breath as the needle is inserted and collects the tissue. Each insertion and collection lasts about 30 seconds or a little longer. You will be told when to exhale. AFTER THE PROCEDURE  You will lie on your back for 12 to 24 hours. If you have a transplanted kidney, you may not have to lie on your back. During this time, your back will probably feel sore. You may stay in the hospital overnight after the procedure so that staff can check your condition.  You may notice some blood in your urine for 24 hours after the test. To detect any problems, your health care providers will:  Monitor your blood pressure and pulse.  Take blood samples to measure the amount of red blood cells.  Examine the urine that you pass.  On rare occasions when bleeding is excessive, it may be necessary to replace lost blood with a transfusion.  It is your responsibility to obtain your test results.  Ask the lab or department performing the test when and how you will get your results. FOR MORE INFORMATION  [employer]: https://mathis.com/  National Kidney Foundation: www.kidney.org  National Kidney and Urologic Diseases Information Clearinghouse: http://kidney.AmenCredit.is   This information is not intended to replace advice given to you by your health care provider. Make sure you discuss any questions you  have with your health care provider.   Document Released: 03/14/2004 Document Revised: 02/22/2013 Document Reviewed: 11/07/2012 Elsevier Interactive Patient Education [employer].

## 2015-05-13 NOTE — Care Management Note (Signed)
Case Management Note  Patient Details  Name: Kristen Mcknight MRN: [id number] Date of Birth: 18-Jun-1950  Subjective/Objective:                    Action/Plan:d/c home no needs or orders.   Expected Discharge Date:                  Expected Discharge Plan:  Home/Self Care  In-House Referral:     Discharge planning Services  CM Consult  Post Acute Care Choice:    Choice offered to:     DME Arranged:    DME Agency:     HH Arranged:    [place] Agency:     Status of Service:  Completed, signed off  Medicare Important Message Given:    Date Medicare IM Given:    Medicare IM give by:    Date Additional Medicare IM Given:    Additional Medicare Important Message give by:     If discussed at [place] of Stay Meetings, dates discussed:    Additional Comments:  Dessa Phi, RN 05/13/2015, 10:52 AM

## 2015-05-16 ENCOUNTER — Encounter (INDEPENDENT_AMBULATORY_CARE_PROVIDER_SITE_OTHER): Payer: Self-pay | Admitting: *Deleted

## 2015-06-13 ENCOUNTER — Ambulatory Visit (INDEPENDENT_AMBULATORY_CARE_PROVIDER_SITE_OTHER): Payer: PRIVATE HEALTH INSURANCE | Admitting: Internal Medicine

## 2015-06-13 ENCOUNTER — Encounter (INDEPENDENT_AMBULATORY_CARE_PROVIDER_SITE_OTHER): Payer: Self-pay | Admitting: Internal Medicine

## 2015-06-13 VITALS — BP 114/58 | HR 76 | Temp 98.2°F | Ht 66.0 in | Wt 163.7 lb

## 2015-06-13 DIAGNOSIS — B1920 Unspecified viral hepatitis C without hepatic coma: Secondary | ICD-10-CM

## 2015-06-13 NOTE — Patient Instructions (Signed)
OV in 3 mos. Labs

## 2015-06-13 NOTE — Progress Notes (Addendum)
Subjective:    Patient ID: Kristen Mcknight, female    DOB: 01-13-1951, 65 y.o.   MRN: [id number]  HPI Referred by Dr. Theador Hawthorne for Hepatitis C.  See Dr Theador Hawthorne for CKD. She says she has interstitial nephritis. Per records, pat has AKI vs CKD. Pt's AKI could be from long term NSAID use/could be from sarcoidosis, could be from Hep C related. She has never done IV drugs. She says she has hx of jaundice in her 69s. She was dating someone that was an IV drug user. She underwent a kidney biopsy 05/02/2015 Kidney Biopsy. I do not have results. Appetite is good. No weight loss. BMs are normal. No melena or BRRB.  No NSAIDs.  No Tylenol.  Her last colonoscopy was in 2008 by Dr. Laural Golden at [facility] with a hyperplastic polyp. 05/29/2015 Hemoglobin 8.6, Hematocrit 27.0. 05/02/2015 H and H 9.4 and 28.4, platelet ct 430. 04/27/2015 Hep C antibody positive.  HBsAG screen negative  Have you ever been treated for Hepatitis C? No Any hx of IV drug abuse or drug abuse? no Are you drinking now? no Any hx of etoh abuse? no Do you have tattoos? She has one tattoo x 10 yrs  Have you ever received a blood transfusion? no When were you diagnosed with Hepatitis C? December of 2016 Any hx of mental illness requiring treatment? no Do you have suicidal thoughts? no  Review of Systems Past Medical History  Diagnosis Date  . Hyperlipidemia   . Chronic kidney disease   . Anemia   . Hepatitis   . DM (diabetes mellitus) ([place])      x 1 year    Past Surgical History  Procedure Laterality Date  . Abdominal hysterectomy      Allergies  Allergen Reactions  . Codeine Hives    Current Outpatient Prescriptions on File Prior to Visit  Medication Sig Dispense Refill  . fluticasone (FLONASE) 50 MCG/ACT nasal spray Place 2 sprays into both nostrils daily as needed for allergies or rhinitis.    Marland Kitchen loratadine (CLARITIN) 10 MG tablet Take 10 mg by mouth daily as needed for allergies. Reported on 06/13/2015    .  nitroGLYCERIN (NITROSTAT) 0.4 MG SL tablet Place 0.4 mg under the tongue every 5 (five) minutes as needed for chest pain.     . pantoprazole (PROTONIX) 20 MG tablet Take 20 mg by mouth daily. Reported on 06/13/2015    . predniSONE (DELTASONE) 10 MG tablet Take 60 mg tablet starting 05/14/2015, take one tablet daily for one week and see your nephrologist for further recommendations on tapering down 42 tablet 2  . Tetrahydrozoline HCl (VISINE OP) Apply 1 drop to eye daily as needed (dry eyes).     No current facility-administered medications on file prior to visit.        Objective:   Physical Exam  Blood pressure 114/58, pulse 76, temperature 98.2 F (36.8 C), height 5\' 6"  (1.676 m), weight 163 lb 11.2 oz (74.254 kg). Alert and oriented. Skin warm and dry. Oral mucosa is moist.   . Sclera anicteric, conjunctivae is pink. Thyroid not enlarged. No cervical lymphadenopathy. Lungs clear. Heart regular rate and rhythm.  Abdomen is soft. Bowel sounds are positive. No hepatomegaly. No abdominal masses felt. No tenderness.  No edema to lower extremities.          Assessment & Plan:  CBC, Hepatic function, AFP, Hep C quaint, Hep C genotype,.PT/INR, urine drug screen. She will have lab work drawn at [facility]  as her insurance is there.  OV in 3 months.  Once labs are back has she has a viral load, will schedule and [place] elastrography.

## 2015-06-14 ENCOUNTER — Encounter (INDEPENDENT_AMBULATORY_CARE_PROVIDER_SITE_OTHER): Payer: Self-pay

## 2015-06-24 ENCOUNTER — Encounter (INDEPENDENT_AMBULATORY_CARE_PROVIDER_SITE_OTHER): Payer: Self-pay

## 2015-06-24 ENCOUNTER — Telehealth (INDEPENDENT_AMBULATORY_CARE_PROVIDER_SITE_OTHER): Payer: Self-pay | Admitting: Internal Medicine

## 2015-06-24 NOTE — Telephone Encounter (Signed)
Results given to patient. Virus undetected.  Ann, She may have an OV in 3 months. Please cancel. She will need OV in 6 months.

## 2015-06-24 NOTE — Telephone Encounter (Signed)
Ms. Bi left a message on our voice mail asking for her lab results that were performed on or around 1.26.17.  Pt's ph# [phone] ext [phone]  Thank you.

## 2015-06-24 NOTE — Telephone Encounter (Signed)
6 mth f/u sch'd 12/11/15, patient aware

## 2015-09-11 ENCOUNTER — Ambulatory Visit (INDEPENDENT_AMBULATORY_CARE_PROVIDER_SITE_OTHER): Payer: PRIVATE HEALTH INSURANCE | Admitting: Internal Medicine

## 2015-12-11 ENCOUNTER — Ambulatory Visit (INDEPENDENT_AMBULATORY_CARE_PROVIDER_SITE_OTHER): Payer: PRIVATE HEALTH INSURANCE | Admitting: Internal Medicine

## 2015-12-11 ENCOUNTER — Encounter (INDEPENDENT_AMBULATORY_CARE_PROVIDER_SITE_OTHER): Payer: Self-pay | Admitting: Internal Medicine

## 2015-12-11 VITALS — BP 130/90 | HR 65 | Temp 97.8°F | Resp 18 | Ht 66.0 in | Wt 183.8 lb

## 2015-12-11 DIAGNOSIS — B1920 Unspecified viral hepatitis C without hepatic coma: Secondary | ICD-10-CM | POA: Diagnosis not present

## 2015-12-11 NOTE — Progress Notes (Signed)
   Subjective:    Patient ID: Kristen Mcknight, female    DOB: 06-29-1950, 65 y.o.   MRN: [id number]  HPIHere today for f/u. Referred by [facility] for Heaptitis C. Last saw in January of this year. Sees to Dr. Frederica Kuster for interstitial nephritis.   She has never done IV drugs. She says she has hx of jaundice in her 70s. She was dating someone that was an IV drug user. She underwent a kidney biopsy 05/02/2015 Kidney Biopsy. I do not have results. Appetite is good. No weight loss. BMs are normal. No melena or BRRB.  No NSAIDs.  Hx of anemia Recent hx of gross hematuria in December after kidney biopsy    06/14/2015 Hemoglobin 10.5. Her last colonoscopy was in 2008 by Dr. Laural Golden at [facility] with a hyperplastic polyp. 05/29/2015 Hemoglobin 8.6, Hematocrit 27.0. 05/02/2015 H and H 9.4 and 28.4, platelet ct 430. 04/27/2015 Hep C antibody positive.  HBsAG screen negative  Have you ever been treated for Hepatitis C? No Any hx of IV drug abuse or drug abuse? no Are you drinking now? no Any hx of etoh abuse? no Do you have tattoos? She has one tattoo x 10 yrs  Have you ever received a blood transfusion? no When were you diagnosed with Hepatitis C? December of 2016 Any hx of mental illness requiring treatment? no Do you have suicidal thoughts? no   Review of Systems Past Medical History:  Diagnosis Date  . Anemia   . Chronic kidney disease   . DM (diabetes mellitus) ([place])     x 1 year  . Hepatitis   . Hyperlipidemia     Past Surgical History:  Procedure Laterality Date  . ABDOMINAL HYSTERECTOMY      Allergies  Allergen Reactions  . Codeine Hives    Current Outpatient Prescriptions on File Prior to Visit  Medication Sig Dispense Refill  . cholecalciferol (VITAMIN D) 400 units TABS tablet Take 1,000 Units by mouth.    . fluticasone (FLONASE) 50 MCG/ACT nasal spray Place 2 sprays into both nostrils daily as needed for allergies or rhinitis.    Marland Kitchen loratadine (CLARITIN) 10 MG tablet Take  10 mg by mouth daily as needed for allergies. Reported on 06/13/2015    . nitroGLYCERIN (NITROSTAT) 0.4 MG SL tablet Place 0.4 mg under the tongue every 5 (five) minutes as needed for chest pain.     . pantoprazole (PROTONIX) 20 MG tablet Take 20 mg by mouth daily. Reported on 06/13/2015    . Tetrahydrozoline HCl (VISINE OP) Apply 1 drop to eye daily as needed (dry eyes).     No current facility-administered medications on file prior to visit.        Objective:   Physical Exam Blood pressure 130/90, pulse 65, temperature 97.8 F (36.6 C), temperature source Oral, resp. rate 18, height 5\' 6"  (1.676 m), weight 183 lb 12.8 oz (83.4 kg). Alert and oriented. Skin warm and dry. Oral mucosa is moist.   . Sclera anicteric, conjunctivae is pink. Thyroid not enlarged. No cervical lymphadenopathy. Lungs clear. Heart regular rate and rhythm.  Abdomen is soft. Bowel sounds are positive. No hepatomegaly. No abdominal masses felt. No tenderness.  No edema to lower extremities.         Assessment & Plan:  Positive Hep C antibody with  Undetected viral load. Hep C quaint today. If negative, no further workup.

## 2015-12-11 NOTE — Patient Instructions (Signed)
Hep C quaint. If undetected, no further work up.

## 2016-08-31 ENCOUNTER — Encounter (INDEPENDENT_AMBULATORY_CARE_PROVIDER_SITE_OTHER): Payer: Self-pay

## 2016-09-02 ENCOUNTER — Ambulatory Visit (INDEPENDENT_AMBULATORY_CARE_PROVIDER_SITE_OTHER): Payer: Commercial Managed Care - PPO | Admitting: Internal Medicine

## 2016-09-02 ENCOUNTER — Encounter (INDEPENDENT_AMBULATORY_CARE_PROVIDER_SITE_OTHER): Payer: Self-pay

## 2016-09-02 ENCOUNTER — Encounter (INDEPENDENT_AMBULATORY_CARE_PROVIDER_SITE_OTHER): Payer: Self-pay | Admitting: Internal Medicine

## 2016-09-02 VITALS — BP 140/80 | HR 72 | Temp 98.6°F | Ht 66.0 in | Wt 179.6 lb

## 2016-09-02 DIAGNOSIS — B182 Chronic viral hepatitis C: Secondary | ICD-10-CM

## 2016-09-02 NOTE — Progress Notes (Addendum)
   Subjective:    Patient ID: Kristen Mcknight, female    DOB: 1950-11-19, 66 y.o.   MRN: [id number]  HPI patient was last seen 12/11/2015. Hx of Hepatitis C. She presents today with positive Hepa C ab. Would like further evaluation of this.  Last Hep C quaint 11/2015 was undetected.  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  No hx of IV drug use , tattoos, blood transfusion.    12/12/2015 Hep C quaint undetected.  04/27/2015 Hep C quaint positive HBsAG screen negative Review of Systems Past Medical History:  Diagnosis Date  . Anemia   . Chronic kidney disease   . DM (diabetes mellitus) ([place])     x 1 year  . Hepatitis   . Hyperlipidemia     Past Surgical History:  Procedure Laterality Date  . ABDOMINAL HYSTERECTOMY      Allergies  Allergen Reactions  . Codeine Hives    Current Outpatient Prescriptions on File Prior to Visit  Medication Sig Dispense Refill  . cholecalciferol (VITAMIN D) 400 units TABS tablet Take 1,000 Units by mouth.    . fluticasone (FLONASE) 50 MCG/ACT nasal spray Place 2 sprays into both nostrils daily as needed for allergies or rhinitis.    Marland Kitchen glipiZIDE (GLUCOTROL) 5 MG tablet 5 mg daily before breakfast.    . nitroGLYCERIN (NITROSTAT) 0.4 MG SL tablet Place 0.4 mg under the tongue every 5 (five) minutes as needed for chest pain.     . pantoprazole (PROTONIX) 20 MG tablet Take 20 mg by mouth daily. Reported on 06/13/2015    . Tetrahydrozoline HCl (VISINE OP) Apply 1 drop to eye daily as needed (dry eyes).     No current facility-administered medications on file prior to visit.        Objective:   Physical Exam Blood pressure 140/80, pulse 72, temperature 98.6 F (37 C), height 5\' 6"  (1.676 m), weight 179 lb 9.6 oz (81.5 kg).Alert and oriented. Skin warm and dry. Oral mucosa is moist.   . Sclera anicteric, conjunctivae is pink. Thyroid not enlarged. No cervical lymphadenopathy. Lungs clear. Heart regular rate and rhythm.  Abdomen is  soft. Bowel sounds are positive. No hepatomegaly. No abdominal masses felt. No tenderness.  No edema to lower extremities.            Assessment & Plan:  Positive Hep C antibody. Will get a Hep C quaint. If positive will treat.

## 2016-09-02 NOTE — Patient Instructions (Signed)
Will get a Hep C quaint.

## 2016-09-07 ENCOUNTER — Encounter (INDEPENDENT_AMBULATORY_CARE_PROVIDER_SITE_OTHER): Payer: Self-pay

## 2017-04-30 ENCOUNTER — Encounter (INDEPENDENT_AMBULATORY_CARE_PROVIDER_SITE_OTHER): Payer: Self-pay | Admitting: *Deleted

## 2017-07-20 DIAGNOSIS — H21541 Posterior synechiae (iris), right eye: Secondary | ICD-10-CM | POA: Diagnosis not present

## 2017-07-20 DIAGNOSIS — Z961 Presence of intraocular lens: Secondary | ICD-10-CM | POA: Diagnosis not present

## 2017-07-20 DIAGNOSIS — H3581 Retinal edema: Secondary | ICD-10-CM | POA: Diagnosis not present

## 2017-07-20 DIAGNOSIS — H30031 Focal chorioretinal inflammation, peripheral, right eye: Secondary | ICD-10-CM | POA: Diagnosis not present

## 2017-07-20 DIAGNOSIS — H2511 Age-related nuclear cataract, right eye: Secondary | ICD-10-CM | POA: Diagnosis not present

## 2017-07-22 DIAGNOSIS — R188 Other ascites: Secondary | ICD-10-CM | POA: Diagnosis not present

## 2017-07-22 DIAGNOSIS — R102 Pelvic and perineal pain: Secondary | ICD-10-CM | POA: Diagnosis not present

## 2017-07-27 ENCOUNTER — Encounter (INDEPENDENT_AMBULATORY_CARE_PROVIDER_SITE_OTHER): Payer: Self-pay | Admitting: Internal Medicine

## 2017-07-27 ENCOUNTER — Ambulatory Visit (INDEPENDENT_AMBULATORY_CARE_PROVIDER_SITE_OTHER): Payer: Commercial Managed Care - PPO | Admitting: Internal Medicine

## 2017-07-27 VITALS — BP 136/80 | HR 76 | Temp 98.4°F | Ht 66.0 in | Wt 180.5 lb

## 2017-07-27 DIAGNOSIS — R1013 Epigastric pain: Secondary | ICD-10-CM | POA: Diagnosis not present

## 2017-07-27 DIAGNOSIS — Z1211 Encounter for screening for malignant neoplasm of colon: Secondary | ICD-10-CM | POA: Diagnosis not present

## 2017-07-27 NOTE — Patient Instructions (Signed)
The risks of bleeding, perforation and infection were reviewed with patient.  

## 2017-07-27 NOTE — Progress Notes (Addendum)
   Subjective:    Patient ID: Kristen Mcknight, female    DOB: 15-Jun-1950, 67 y.o.   MRN: [id number]  HPI Patient here today requesting to schedule a colonoscopy/EGD. Her last colonoscopy was in 2008 (Dr. Laural Golden, average risk). Exam normal. Small polyp ablated via cold biopsy from hepatic flexure. Biopsy Hyperplastic polyp.  She also tells me today she has epigastric and rt back pain. She says sometimes she has swelling in her abdomen.  She has been using Miralax which has helped with the constipation.  She does have cramps with the Miralax. She says she recently had an US transvaginal and was normal.  She does have a small amt of heart burn and takes Protonix 20mg  as needed. She refuses to take the Protonix daily. She is afraid it will cause cancer. There has been no weight loss  Hx of diabetes.    Review of Systems Past Medical History:  Diagnosis Date  . Anemia   . Chronic kidney disease   . DM (diabetes mellitus) ([place])     x 1 year  . Hepatitis   . Hyperlipidemia     Past Surgical History:  Procedure Laterality Date  . ABDOMINAL HYSTERECTOMY      Allergies  Allergen Reactions  . Codeine Hives    Current Outpatient Medications on File Prior to Visit  Medication Sig Dispense Refill  . cholecalciferol (VITAMIN D) 400 units TABS tablet Take 1,000 Units by mouth.    . fluticasone (FLONASE) 50 MCG/ACT nasal spray Place 2 sprays into both nostrils daily as needed for allergies or rhinitis.    . furosemide (LASIX) 20 MG tablet Take 10 mg by mouth.    Marland Kitchen glipiZIDE (GLUCOTROL) 5 MG tablet 5 mg daily before breakfast.    . nitroGLYCERIN (NITROSTAT) 0.4 MG SL tablet Place 0.4 mg under the tongue every 5 (five) minutes as needed for chest pain.     . pantoprazole (PROTONIX) 20 MG tablet Take 20 mg by mouth as needed. Reported on 06/13/2015     No current facility-administered medications on file prior to visit.         Objective:   Physical Exam Blood pressure 136/80, pulse 76,  temperature 98.4 F (36.9 C), height 5\' 6"  (1.676 m), weight 180 lb 8 oz (81.9 kg). Alert and oriented. Skin warm and dry. Oral mucosa is moist.   . Sclera anicteric, conjunctivae is pink. Thyroid not enlarged. No cervical lymphadenopathy. Lungs clear. Heart regular rate and rhythm.  Abdomen is soft. Bowel sounds are positive. No hepatomegaly. No abdominal masses felt. No tenderness.  No edema to lower extremities.          Assessment & Plan:  Epigastric pain. Am going to get an US abdomen. Will set her up for a screening colonoscopy. Last colonoscopy was in 2008.

## 2017-07-30 ENCOUNTER — Encounter (INDEPENDENT_AMBULATORY_CARE_PROVIDER_SITE_OTHER): Payer: Self-pay | Admitting: *Deleted

## 2017-07-30 DIAGNOSIS — Z1211 Encounter for screening for malignant neoplasm of colon: Secondary | ICD-10-CM | POA: Insufficient documentation

## 2017-07-30 NOTE — Telephone Encounter (Signed)
Error   This encounter was created in error - please disregard. 

## 2017-08-03 DIAGNOSIS — R1013 Epigastric pain: Secondary | ICD-10-CM | POA: Diagnosis not present

## 2017-08-03 DIAGNOSIS — Z1231 Encounter for screening mammogram for malignant neoplasm of breast: Secondary | ICD-10-CM | POA: Diagnosis not present

## 2017-08-05 ENCOUNTER — Other Ambulatory Visit (INDEPENDENT_AMBULATORY_CARE_PROVIDER_SITE_OTHER): Payer: Self-pay | Admitting: Internal Medicine

## 2017-08-16 ENCOUNTER — Telehealth (INDEPENDENT_AMBULATORY_CARE_PROVIDER_SITE_OTHER): Payer: Self-pay | Admitting: *Deleted

## 2017-08-16 ENCOUNTER — Encounter (INDEPENDENT_AMBULATORY_CARE_PROVIDER_SITE_OTHER): Payer: Self-pay | Admitting: *Deleted

## 2017-08-16 MED ORDER — PEG 3350-KCL-NA BICARB-NACL 420 G PO SOLR: 4000.0000 mL | Freq: Once | ORAL | 0 refills | Status: AC

## 2017-08-16 NOTE — Telephone Encounter (Signed)
Patient needs trilyte 

## 2017-09-15 ENCOUNTER — Other Ambulatory Visit: Payer: Self-pay

## 2017-09-15 ENCOUNTER — Ambulatory Visit (HOSPITAL_COMMUNITY)
Admission: RE | Admit: 2017-09-15 | Discharge: 2017-09-15 | Disposition: A | Discharge status: Home / Self Care | Payer: Commercial Managed Care - PPO | Source: Ambulatory Visit | Attending: Internal Medicine | Admitting: Internal Medicine

## 2017-09-15 ENCOUNTER — Encounter (HOSPITAL_COMMUNITY)
Admission: RE | Disposition: A | Discharge status: Home / Self Care | Payer: Self-pay | Source: Ambulatory Visit | Attending: Internal Medicine

## 2017-09-15 ENCOUNTER — Encounter (HOSPITAL_COMMUNITY): Payer: Self-pay | Admitting: *Deleted

## 2017-09-15 DIAGNOSIS — Z87891 Personal history of nicotine dependence: Secondary | ICD-10-CM | POA: Insufficient documentation

## 2017-09-15 DIAGNOSIS — K648 Other hemorrhoids: Secondary | ICD-10-CM | POA: Diagnosis not present

## 2017-09-15 DIAGNOSIS — Z7984 Long term (current) use of oral hypoglycemic drugs: Secondary | ICD-10-CM | POA: Diagnosis not present

## 2017-09-15 DIAGNOSIS — E785 Hyperlipidemia, unspecified: Secondary | ICD-10-CM | POA: Insufficient documentation

## 2017-09-15 DIAGNOSIS — K644 Residual hemorrhoidal skin tags: Secondary | ICD-10-CM | POA: Insufficient documentation

## 2017-09-15 DIAGNOSIS — Z79899 Other long term (current) drug therapy: Secondary | ICD-10-CM | POA: Diagnosis not present

## 2017-09-15 DIAGNOSIS — N189 Chronic kidney disease, unspecified: Secondary | ICD-10-CM | POA: Insufficient documentation

## 2017-09-15 DIAGNOSIS — D120 Benign neoplasm of cecum: Secondary | ICD-10-CM | POA: Diagnosis not present

## 2017-09-15 DIAGNOSIS — Z885 Allergy status to narcotic agent status: Secondary | ICD-10-CM | POA: Diagnosis not present

## 2017-09-15 DIAGNOSIS — Z1211 Encounter for screening for malignant neoplasm of colon: Secondary | ICD-10-CM

## 2017-09-15 DIAGNOSIS — E1122 Type 2 diabetes mellitus with diabetic chronic kidney disease: Secondary | ICD-10-CM | POA: Insufficient documentation

## 2017-09-15 DIAGNOSIS — H209 Unspecified iridocyclitis: Secondary | ICD-10-CM

## 2017-09-15 HISTORY — PX: POLYPECTOMY: SHX5525

## 2017-09-15 HISTORY — PX: COLONOSCOPY: SHX5424

## 2017-09-15 HISTORY — DX: Sarcoidosis, unspecified: D86.9

## 2017-09-15 LAB — GLUCOSE, CAPILLARY: GLUCOSE-CAPILLARY: 115 mg/dL — AB (ref 65–99)

## 2017-09-15 SURGERY — COLONOSCOPY
Anesthesia: Moderate Sedation

## 2017-09-15 MED ORDER — MIDAZOLAM HCL 5 MG/5ML IJ SOLN
INTRAMUSCULAR | Status: AC
  Filled 2017-09-15: qty 10

## 2017-09-15 MED ORDER — SODIUM CHLORIDE 0.9 % IV SOLN
INTRAVENOUS | Status: DC
  Administered 2017-09-15: 1000 mL via INTRAVENOUS

## 2017-09-15 MED ORDER — MIDAZOLAM HCL 5 MG/5ML IJ SOLN
INTRAMUSCULAR | Status: DC | PRN
  Administered 2017-09-15: 2 mg via INTRAVENOUS
  Administered 2017-09-15: 1 mg via INTRAVENOUS
  Administered 2017-09-15 (×2): 2 mg via INTRAVENOUS

## 2017-09-15 MED ORDER — STERILE WATER FOR IRRIGATION IR SOLN
Status: DC | PRN
  Administered 2017-09-15: 15 mL

## 2017-09-15 MED ORDER — MEPERIDINE HCL 50 MG/ML IJ SOLN
INTRAMUSCULAR | Status: DC
Start: 2017-09-15 — End: 2017-09-15
  Filled 2017-09-15: qty 1

## 2017-09-15 MED ORDER — MEPERIDINE HCL 50 MG/ML IJ SOLN
INTRAMUSCULAR | Status: DC | PRN
  Administered 2017-09-15 (×2): 25 mg via INTRAVENOUS

## 2017-09-15 NOTE — H&P (Signed)
Kristen Mcknight is an 67 y.o. female.   Chief Complaint: Patient is here for colonoscopy. HPI: Patient is 67 year old African-American female who is here for screening colonoscopy.  Last exam was in 2008 with removal of one small polyp and was hyperplastic.  She denies abdominal pain change in bowel habits or rectal bleeding.  She generally has 2 bowel movements per week which has been her pattern for years. Family history is negative for CRC.  Past Medical History:  Diagnosis Date  . Anemia   . Chronic kidney disease   . DM (diabetes mellitus) ([place])     x 1 year  . Hepatitis   . Hyperlipidemia   . Sarcoidosis     Past Surgical History:  Procedure Laterality Date  . ABDOMINAL HYSTERECTOMY      Family History  Problem Relation Age of Onset  . Asthma Mother   . CAD Mother   . Lung cancer Father   . Lupus Other    Social History:  reports that she quit smoking about 3 years ago. She has never used smokeless tobacco. She reports that she does not drink alcohol or use drugs.  Allergies:  Allergies  Allergen Reactions  . Codeine Hives    Medications Prior to Admission  Medication Sig Dispense Refill  . fluticasone (FLONASE) 50 MCG/ACT nasal spray Place 2 sprays into both nostrils daily as needed for allergies or rhinitis.    . folic acid (FOLVITE) 1 MG tablet Take 1 mg by mouth daily.    . furosemide (LASIX) 20 MG tablet Take 20 mg by mouth daily.     Marland Kitchen glipiZIDE (GLUCOTROL XL) 5 MG 24 hr tablet Take 5 mg by mouth daily with breakfast.    . methotrexate (RHEUMATREX) 2.5 MG tablet Take 15 mg by mouth every Tuesday. Caution:Chemotherapy. Protect from light.    . pantoprazole (PROTONIX) 40 MG tablet Take 40 mg by mouth daily as needed for heartburn or indigestion.     . rosuvastatin (CRESTOR) 5 MG tablet Take 5 mg by mouth daily.    . nitroGLYCERIN (NITROSTAT) 0.4 MG SL tablet Place 0.4 mg under the tongue every 5 (five) minutes as needed for chest pain.       No results found  for this or any previous visit (from the past 48 hour(s)). No results found.  ROS  Blood pressure (!) 148/72, pulse (!) 59, temperature 98 F (36.7 C), temperature source Oral, resp. rate 14, height 5\' 6"  (1.676 m), weight 180 lb (81.6 kg), SpO2 98 %. Physical Exam  Constitutional: She appears well-developed and well-nourished.  HENT:  Mouth/Throat: Oropharynx is clear and moist.  Eyes: Conjunctivae are normal. No scleral icterus.  Neck: No thyromegaly present.  Cardiovascular: Normal rate, regular rhythm and normal heart sounds.  No murmur heard. Respiratory: Effort normal and breath sounds normal.  GI:  Pfannenstiel scar.  Abdomen is symmetrical soft and nontender without organomegaly or masses.  Musculoskeletal: She exhibits no edema.  Lymphadenopathy:    She has no cervical adenopathy.  Neurological: She is alert.  Skin: Skin is warm and dry.     Assessment/Plan Average risk screening colonoscopy.  Hildred Laser, MD 09/15/2017, 8:17 AM

## 2017-09-15 NOTE — Op Note (Signed)
[facility] Patient Name: Kristen Mcknight Procedure Date: 09/15/2017 8:18 AM MRN: [id number] Date of Birth: 02/19/1951 Attending MD: Hildred Laser , MD CSN: [id number] Age: 67 Admit Type: Outpatient Procedure:                Colonoscopy Indications:              Screening for colorectal malignant neoplasm Providers:                Hildred Laser, MD, Charlsie Quest. Theda Sers RN, RN, Nelma Rothman, Technician Referring MD:             Manon Hilding, MD Medicines:                Meperidine 50 mg IV, Midazolam 7 mg IV Complications:            No immediate complications. Estimated Blood Loss:     Estimated blood loss was minimal. Procedure:                Pre-Anesthesia Assessment:                           - Prior to the procedure, a History and Physical                            was performed, and patient medications and                            allergies were reviewed. The patient's tolerance of                            previous anesthesia was also reviewed. The risks                            and benefits of the procedure and the sedation                            options and risks were discussed with the patient.                            All questions were answered, and informed consent                            was obtained. Prior Anticoagulants: The patient has                            taken no previous anticoagulant or antiplatelet                            agents. ASA Grade Assessment: III - A patient with                            severe systemic disease. After reviewing the risks  and benefits, the patient was deemed in                            satisfactory condition to undergo the procedure.                           After obtaining informed consent, the colonoscope                            was passed under direct vision. Throughout the                            procedure, the patient's blood pressure, pulse, and                             oxygen saturations were monitored continuously. The                            EC-[id number] ([id number]) scope was introduced through                            the anus and advanced to the the cecum, identified                            by appendiceal orifice and ileocecal valve. The                            colonoscopy was performed without difficulty. The                            patient tolerated the procedure well. The quality                            of the bowel preparation was good. The ileocecal                            valve, appendiceal orifice, and rectum were                            photographed. Scope In: 8:30:15 AM Scope Out: 8:50:13 AM Scope Withdrawal Time: 0 hours 12 minutes 49 seconds  Total Procedure Duration: 0 hours 19 minutes 58 seconds  Findings:      The perianal and digital rectal examinations were normal.      A 5 mm polyp was found in the cecum. The polyp was sessile. The polyp       was removed with a cold snare. Resection and retrieval were complete.      The exam was otherwise without abnormality.      Internal hemorrhoids were found during retroflexion. The hemorrhoids       were small. Impression:               - One 5 mm polyp in the cecum, removed with a cold  snare. Resected and retrieved.                           - The examination was otherwise normal.                           - Internal hemorrhoids. Moderate Sedation:      Moderate (conscious) sedation was administered by the endoscopy nurse       and supervised by the endoscopist. The following parameters were       monitored: oxygen saturation, heart rate, blood pressure, CO2       capnography and response to care. Total physician intraservice time was       28 minutes. Recommendation:           - Patient has a contact number available for                            emergencies. The signs and symptoms of potential                             delayed complications were discussed with the                            patient. Return to normal activities tomorrow.                            Written discharge instructions were provided to the                            patient.                           - Resume previous diet today.                           - Continue present medications.                           - No aspirin, ibuprofen, naproxen, or other                            non-steroidal anti-inflammatory drugs for 1 day.                           - Await pathology results.                           - Repeat colonoscopy is recommended. The                            colonoscopy date will be determined after pathology                            results from today's exam become available for  review. Procedure Code(s):        --- Professional ---                           [phone], Colonoscopy, flexible; with removal of                            tumor(s), polyp(s), or other lesion(s) by snare                            technique                           G0500, Moderate sedation services provided by the                            same physician or other qualified health care                            professional performing a gastrointestinal                            endoscopic service that sedation supports,                            requiring the presence of an independent trained                            observer to assist in the monitoring of the                            patient's level of consciousness and physiological                            status; initial 15 minutes of intra-service time;                            patient age 20 years or older (additional time may                            be reported with [phone], as appropriate)                           [phone], Moderate sedation services provided by the                            same physician or other qualified health  care                            professional performing the diagnostic or                            therapeutic service that the sedation supports,  requiring the presence of an independent trained                            observer to assist in the monitoring of the                            patient's level of consciousness and physiological                            status; each additional 15 minutes intraservice                            time (List separately in addition to code for                            primary service) Diagnosis Code(s):        --- Professional ---                           Z12.11, Encounter for screening for malignant                            neoplasm of colon                           D12.0, Benign neoplasm of cecum                           K64.4, Residual hemorrhoidal skin tags CPT copyright 2017 American [employer] Association. All rights reserved. The codes documented in this report are preliminary and upon coder review may  be revised to meet current compliance requirements. Hildred Laser, MD Hildred Laser, MD 09/15/2017 8:56:34 AM This report has been signed electronically. Number of Addenda: 0

## 2017-09-15 NOTE — Discharge Instructions (Signed)
No aspirin or NSAIDs for 1 week. Resume usual medications and diet as before. No driving for 24 hours. Physician will call with biopsy results.   Colonoscopy, Adult, Care After This sheet gives you information about how to care for yourself after your procedure. Your doctor may also give you more specific instructions. If you have problems or questions, call your doctor. Follow these instructions at home: General instructions   For the first 24 hours after the procedure: ? Do not drive or use machinery. ? Do not sign important documents. ? Do not drink alcohol. ? Do your daily activities more slowly than normal. ? Eat foods that are soft and easy to digest. ? Rest often.  Take over-the-counter or prescription medicines only as told by your doctor.  It is up to you to get the results of your procedure. Ask your doctor, or the department performing the procedure, when your results will be ready. To help cramping and bloating:  Try walking around.  Put heat on your belly (abdomen) as told by your doctor. Use a heat source that your doctor recommends, such as a moist heat pack or a heating pad. ? Put a towel between your skin and the heat source. ? Leave the heat on for 20-30 minutes. ? Remove the heat if your skin turns bright red. This is especially important if you cannot feel pain, heat, or cold. You can get burned. Eating and drinking  Drink enough fluid to keep your pee (urine) clear or pale yellow.  Return to your normal diet as told by your doctor. Avoid heavy or fried foods that are hard to digest.  Avoid drinking alcohol for as long as told by your doctor. Contact a doctor if:  You have blood in your poop (stool) 2-3 days after the procedure. Get help right away if:  You have more than a small amount of blood in your poop.  You see large clumps of tissue (blood clots) in your poop.  Your belly is swollen.  You feel sick to your stomach (nauseous).  You throw  up (vomit).  You have a fever.  You have belly pain that gets worse, and medicine does not help your pain. This information is not intended to replace advice given to you by your health care provider. Make sure you discuss any questions you have with your health care provider. Document Released: 06/06/2010 Document Revised: 01/27/2016 Document Reviewed: 01/27/2016 Elsevier Interactive Patient Education  2017 [employer].  Colon Polyps Polyps are tissue growths inside the body. Polyps can grow in many places, including the large intestine (colon). A polyp may be a round bump or a mushroom-shaped growth. You could have one polyp or several. Most colon polyps are noncancerous (benign). However, some colon polyps can become cancerous over time. What are the causes? The exact cause of colon polyps is not known. What increases the risk? This condition is more likely to develop in people who:  Have a family history of colon cancer or colon polyps.  Are older than 18 or older than 45 if they are African American.  Have inflammatory bowel disease, such as ulcerative colitis or Crohn disease.  Are overweight.  Smoke cigarettes.  Do not get enough exercise.  Drink too much alcohol.  Eat a diet that is: ? High in fat and red meat. ? Low in fiber.  Had childhood cancer that was treated with abdominal radiation.  What are the signs or symptoms? Most polyps do not cause symptoms.  If you have symptoms, they may include:  Blood coming from your rectum when having a bowel movement.  Blood in your stool.The stool may look dark red or black.  A change in bowel habits, such as constipation or diarrhea.  How is this diagnosed? This condition is diagnosed with a colonoscopy. This is a procedure that uses a lighted, flexible scope to look at the inside of your colon. How is this treated? Treatment for this condition involves removing any polyps that are found. Those polyps will then be  tested for cancer. If cancer is found, your health care provider will talk to you about options for colon cancer treatment. Follow these instructions at home: Diet  Eat plenty of fiber, such as fruits, vegetables, and whole grains.  Eat foods that are high in calcium and vitamin D, such as milk, cheese, yogurt, eggs, liver, fish, and broccoli.  Limit foods high in fat, red meats, and processed meats, such as hot dogs, sausage, bacon, and lunch meats.  Maintain a healthy weight, or lose weight if recommended by your health care provider. General instructions  Do not smoke cigarettes.  Do not drink alcohol excessively.  Keep all follow-up visits as told by your health care provider. This is important. This includes keeping regularly scheduled colonoscopies. Talk to your health care provider about when you need a colonoscopy.  Exercise every day or as told by your health care provider. Contact a health care provider if:  You have new or worsening bleeding during a bowel movement.  You have new or increased blood in your stool.  You have a change in bowel habits.  You unexpectedly lose weight. This information is not intended to replace advice given to you by your health care provider. Make sure you discuss any questions you have with your health care provider. Document Released: 01/29/2004 Document Revised: 10/10/2015 Document Reviewed: 03/25/2015 Elsevier Interactive Patient Education  2018 [employer].  Hemorrhoids Hemorrhoids are swollen veins in and around the rectum or anus. There are two types of hemorrhoids:  Internal hemorrhoids. These occur in the veins that are just inside the rectum. They may poke through to the outside and become irritated and painful.  External hemorrhoids. These occur in the veins that are outside of the anus and can be felt as a painful swelling or hard lump near the anus.  Most hemorrhoids do not cause serious problems, and they can be managed  with home treatments such as diet and lifestyle changes. If home treatments do not help your symptoms, procedures can be done to shrink or remove the hemorrhoids. What are the causes? This condition is caused by increased pressure in the anal area. This pressure may result from various things, including:  Constipation.  Straining to have a bowel movement.  Diarrhea.  Pregnancy.  Obesity.  Sitting for long periods of time.  Heavy lifting or other activity that causes you to strain.  Anal sex.  What are the signs or symptoms? Symptoms of this condition include:  Pain.  Anal itching or irritation.  Rectal bleeding.  Leakage of stool (feces).  Anal swelling.  One or more lumps around the anus.  How is this diagnosed? This condition can often be diagnosed through a visual exam. Other exams or tests may also be done, such as:  Examination of the rectal area with a gloved hand (digital rectal exam).  Examination of the anal canal using a small tube (anoscope).  A blood test, if you have lost a  significant amount of blood.  A test to look inside the colon (sigmoidoscopy or colonoscopy).  How is this treated? This condition can usually be treated at home. However, various procedures may be done if dietary changes, lifestyle changes, and other home treatments do not help your symptoms. These procedures can help make the hemorrhoids smaller or remove them completely. Some of these procedures involve surgery, and others do not. Common procedures include:  Rubber band ligation. Rubber bands are placed at the base of the hemorrhoids to cut off the blood supply to them.  Sclerotherapy. Medicine is injected into the hemorrhoids to shrink them.  Infrared coagulation. A type of light energy is used to get rid of the hemorrhoids.  Hemorrhoidectomy surgery. The hemorrhoids are surgically removed, and the veins that supply them are tied off.  Stapled hemorrhoidopexy surgery. A  circular stapling device is used to remove the hemorrhoids and use staples to cut off the blood supply to them.  Follow these instructions at home: Eating and drinking  Eat foods that have a lot of fiber in them, such as whole grains, beans, nuts, fruits, and vegetables. Ask your health care provider about taking products that have added fiber (fiber supplements).  Drink enough fluid to keep your urine clear or pale yellow. Managing pain and swelling  Take warm sitz baths for 20 minutes, 3-4 times a day to ease pain and discomfort.  If directed, apply ice to the affected area. Using ice packs between sitz baths may be helpful. ? Put ice in a plastic bag. ? Place a towel between your skin and the bag. ? Leave the ice on for 20 minutes, 2-3 times a day. General instructions  Take over-the-counter and prescription medicines only as told by your health care provider.  Use medicated creams or suppositories as told.  Exercise regularly.  Go to the bathroom when you have the urge to have a bowel movement. Do not wait.  Avoid straining to have bowel movements.  Keep the anal area dry and clean. Use wet toilet paper or moist towelettes after a bowel movement.  Do not sit on the toilet for long periods of time. This increases blood pooling and pain. Contact a health care provider if:  You have increasing pain and swelling that are not controlled by treatment or medicine.  You have uncontrolled bleeding.  You have difficulty having a bowel movement, or you are unable to have a bowel movement.  You have pain or inflammation outside the area of the hemorrhoids. This information is not intended to replace advice given to you by your health care provider. Make sure you discuss any questions you have with your health care provider. Document Released: 05/01/2000 Document Revised: 10/02/2015 Document Reviewed: 01/16/2015 Elsevier Interactive Patient Education  [employer].

## 2017-09-20 ENCOUNTER — Encounter (HOSPITAL_COMMUNITY): Payer: Self-pay | Admitting: Internal Medicine

## 2017-09-29 DIAGNOSIS — I10 Essential (primary) hypertension: Secondary | ICD-10-CM | POA: Diagnosis not present

## 2017-09-29 DIAGNOSIS — E7800 Pure hypercholesterolemia, unspecified: Secondary | ICD-10-CM | POA: Diagnosis not present

## 2017-09-29 DIAGNOSIS — E1165 Type 2 diabetes mellitus with hyperglycemia: Secondary | ICD-10-CM | POA: Diagnosis not present

## 2017-10-05 DIAGNOSIS — D860 Sarcoidosis of lung: Secondary | ICD-10-CM | POA: Diagnosis not present

## 2017-10-05 DIAGNOSIS — N179 Acute kidney failure, unspecified: Secondary | ICD-10-CM | POA: Diagnosis not present

## 2017-10-05 DIAGNOSIS — E7800 Pure hypercholesterolemia, unspecified: Secondary | ICD-10-CM | POA: Diagnosis not present

## 2017-10-05 DIAGNOSIS — Z23 Encounter for immunization: Secondary | ICD-10-CM | POA: Diagnosis not present

## 2017-10-05 DIAGNOSIS — Z6828 Body mass index (BMI) 28.0-28.9, adult: Secondary | ICD-10-CM | POA: Diagnosis not present

## 2017-10-05 DIAGNOSIS — H44131 Sympathetic uveitis, right eye: Secondary | ICD-10-CM | POA: Diagnosis not present

## 2017-10-05 DIAGNOSIS — E1165 Type 2 diabetes mellitus with hyperglycemia: Secondary | ICD-10-CM | POA: Diagnosis not present

## 2017-10-05 DIAGNOSIS — I10 Essential (primary) hypertension: Secondary | ICD-10-CM | POA: Diagnosis not present

## 2017-11-19 DIAGNOSIS — H21541 Posterior synechiae (iris), right eye: Secondary | ICD-10-CM | POA: Diagnosis not present

## 2017-11-19 DIAGNOSIS — H2511 Age-related nuclear cataract, right eye: Secondary | ICD-10-CM | POA: Diagnosis not present

## 2017-11-19 DIAGNOSIS — H30031 Focal chorioretinal inflammation, peripheral, right eye: Secondary | ICD-10-CM | POA: Diagnosis not present

## 2017-11-19 DIAGNOSIS — Z961 Presence of intraocular lens: Secondary | ICD-10-CM | POA: Diagnosis not present

## 2017-11-29 DIAGNOSIS — S92532A Displaced fracture of distal phalanx of left lesser toe(s), initial encounter for closed fracture: Secondary | ICD-10-CM | POA: Diagnosis not present

## 2017-11-29 DIAGNOSIS — S92512A Displaced fracture of proximal phalanx of left lesser toe(s), initial encounter for closed fracture: Secondary | ICD-10-CM | POA: Diagnosis not present

## 2017-12-02 DIAGNOSIS — W2203XA Walked into furniture, initial encounter: Secondary | ICD-10-CM | POA: Diagnosis not present

## 2017-12-02 DIAGNOSIS — S92515A Nondisplaced fracture of proximal phalanx of left lesser toe(s), initial encounter for closed fracture: Secondary | ICD-10-CM | POA: Diagnosis not present

## 2017-12-07 DIAGNOSIS — L570 Actinic keratosis: Secondary | ICD-10-CM | POA: Diagnosis not present

## 2017-12-07 DIAGNOSIS — D485 Neoplasm of uncertain behavior of skin: Secondary | ICD-10-CM | POA: Diagnosis not present

## 2017-12-30 DIAGNOSIS — R809 Proteinuria, unspecified: Secondary | ICD-10-CM | POA: Diagnosis not present

## 2017-12-30 DIAGNOSIS — N183 Chronic kidney disease, stage 3 (moderate): Secondary | ICD-10-CM | POA: Diagnosis not present

## 2017-12-30 DIAGNOSIS — E559 Vitamin D deficiency, unspecified: Secondary | ICD-10-CM | POA: Diagnosis not present

## 2017-12-30 DIAGNOSIS — I129 Hypertensive chronic kidney disease with stage 1 through stage 4 chronic kidney disease, or unspecified chronic kidney disease: Secondary | ICD-10-CM | POA: Diagnosis not present

## 2017-12-30 DIAGNOSIS — D649 Anemia, unspecified: Secondary | ICD-10-CM | POA: Diagnosis not present

## 2018-01-04 DIAGNOSIS — N182 Chronic kidney disease, stage 2 (mild): Secondary | ICD-10-CM | POA: Diagnosis not present

## 2018-01-04 DIAGNOSIS — E559 Vitamin D deficiency, unspecified: Secondary | ICD-10-CM | POA: Diagnosis not present

## 2018-01-18 DIAGNOSIS — H30031 Focal chorioretinal inflammation, peripheral, right eye: Secondary | ICD-10-CM | POA: Diagnosis not present

## 2018-01-18 DIAGNOSIS — H21541 Posterior synechiae (iris), right eye: Secondary | ICD-10-CM | POA: Diagnosis not present

## 2018-01-18 DIAGNOSIS — Z961 Presence of intraocular lens: Secondary | ICD-10-CM | POA: Diagnosis not present

## 2018-01-18 DIAGNOSIS — H2511 Age-related nuclear cataract, right eye: Secondary | ICD-10-CM | POA: Diagnosis not present

## 2018-03-22 DIAGNOSIS — Z961 Presence of intraocular lens: Secondary | ICD-10-CM | POA: Diagnosis not present

## 2018-03-22 DIAGNOSIS — H30031 Focal chorioretinal inflammation, peripheral, right eye: Secondary | ICD-10-CM | POA: Diagnosis not present

## 2018-03-22 DIAGNOSIS — H2511 Age-related nuclear cataract, right eye: Secondary | ICD-10-CM | POA: Diagnosis not present

## 2018-03-22 DIAGNOSIS — H21541 Posterior synechiae (iris), right eye: Secondary | ICD-10-CM | POA: Diagnosis not present

## 2018-03-22 DIAGNOSIS — Z79899 Other long term (current) drug therapy: Secondary | ICD-10-CM | POA: Diagnosis not present

## 2018-03-25 DIAGNOSIS — Z5181 Encounter for therapeutic drug level monitoring: Secondary | ICD-10-CM | POA: Diagnosis not present

## 2018-03-25 DIAGNOSIS — H30033 Focal chorioretinal inflammation, peripheral, bilateral: Secondary | ICD-10-CM | POA: Diagnosis not present

## 2018-03-25 DIAGNOSIS — E1165 Type 2 diabetes mellitus with hyperglycemia: Secondary | ICD-10-CM | POA: Diagnosis not present

## 2018-03-25 DIAGNOSIS — I10 Essential (primary) hypertension: Secondary | ICD-10-CM | POA: Diagnosis not present

## 2018-03-25 DIAGNOSIS — E7800 Pure hypercholesterolemia, unspecified: Secondary | ICD-10-CM | POA: Diagnosis not present

## 2018-03-25 DIAGNOSIS — Z79899 Other long term (current) drug therapy: Secondary | ICD-10-CM | POA: Diagnosis not present

## 2018-04-01 DIAGNOSIS — E119 Type 2 diabetes mellitus without complications: Secondary | ICD-10-CM | POA: Diagnosis not present

## 2018-04-01 DIAGNOSIS — H25811 Combined forms of age-related cataract, right eye: Secondary | ICD-10-CM | POA: Diagnosis not present

## 2018-04-01 DIAGNOSIS — H21541 Posterior synechiae (iris), right eye: Secondary | ICD-10-CM | POA: Diagnosis not present

## 2018-04-01 DIAGNOSIS — H40013 Open angle with borderline findings, low risk, bilateral: Secondary | ICD-10-CM | POA: Diagnosis not present

## 2018-04-01 DIAGNOSIS — Z961 Presence of intraocular lens: Secondary | ICD-10-CM | POA: Diagnosis not present

## 2018-04-05 DIAGNOSIS — D860 Sarcoidosis of lung: Secondary | ICD-10-CM | POA: Diagnosis not present

## 2018-04-05 DIAGNOSIS — H44131 Sympathetic uveitis, right eye: Secondary | ICD-10-CM | POA: Diagnosis not present

## 2018-04-05 DIAGNOSIS — Z1389 Encounter for screening for other disorder: Secondary | ICD-10-CM | POA: Diagnosis not present

## 2018-04-05 DIAGNOSIS — I10 Essential (primary) hypertension: Secondary | ICD-10-CM | POA: Diagnosis not present

## 2018-04-05 DIAGNOSIS — E7800 Pure hypercholesterolemia, unspecified: Secondary | ICD-10-CM | POA: Diagnosis not present

## 2018-04-05 DIAGNOSIS — Z1331 Encounter for screening for depression: Secondary | ICD-10-CM | POA: Diagnosis not present

## 2018-04-05 DIAGNOSIS — N179 Acute kidney failure, unspecified: Secondary | ICD-10-CM | POA: Diagnosis not present

## 2018-04-05 DIAGNOSIS — E1165 Type 2 diabetes mellitus with hyperglycemia: Secondary | ICD-10-CM | POA: Diagnosis not present

## 2018-04-27 NOTE — Patient Instructions (Signed)
Your procedure is scheduled on: 05/06/2018   Report to [facility] at  61  AM.  Call this number if you have problems the morning of surgery: [phone]   Do not eat food or drink liquids :After Midnight.      Take these medicines the morning of surgery with A SIP OF WATER: Protonix.  Do not wear jewelry, make-up or nail polish.  Do not wear lotions, powders, or perfumes. You may wear deodorant.  Do not shave 48 hours prior to surgery.  Do not bring valuables to the hospital.  Contacts, dentures or bridgework may not be worn into surgery.  Leave suitcase in the car. After surgery it may be brought to your room.  For patients admitted to the hospital, checkout time is 11:00 AM the day of discharge.   Patients discharged the day of surgery will not be allowed to drive home.  :     Please read over the following fact sheets that you were given: Coughing and Deep Breathing, Surgical Site Infection Prevention, Anesthesia Post-op Instructions and Care and Recovery After Surgery    Cataract A cataract is a clouding of the lens of the eye. When a lens becomes cloudy, vision is reduced based on the degree and nature of the clouding. Many cataracts reduce vision to some degree. Some cataracts make people more near-sighted as they develop. Other cataracts increase glare. Cataracts that are ignored and become worse can sometimes look white. The white color can be seen through the pupil. CAUSES   Aging. However, cataracts may occur at any age, even in newborns.   Certain drugs.   Trauma to the eye.   Certain diseases such as diabetes.   Specific eye diseases such as chronic inflammation inside the eye or a sudden attack of a rare form of glaucoma.   Inherited or acquired medical problems.  SYMPTOMS   Gradual, progressive drop in vision in the affected eye.   Severe, rapid visual loss. This most often happens when trauma is the cause.  DIAGNOSIS  To detect a cataract, an eye doctor  examines the lens. Cataracts are best diagnosed with an exam of the eyes with the pupils enlarged (dilated) by drops.  TREATMENT  For an early cataract, vision may improve by using different eyeglasses or stronger lighting. If that does not help your vision, surgery is the only effective treatment. A cataract needs to be surgically removed when vision loss interferes with your everyday activities, such as driving, reading, or watching TV. A cataract may also have to be removed if it prevents examination or treatment of another eye problem. Surgery removes the cloudy lens and usually replaces it with a substitute lens (intraocular lens, IOL).  At a time when both you and your doctor agree, the cataract will be surgically removed. If you have cataracts in both eyes, only one is usually removed at a time. This allows the operated eye to heal and be out of danger from any possible problems after surgery (such as infection or poor wound healing). In rare cases, a cataract may be doing damage to your eye. In these cases, your caregiver may advise surgical removal right away. The vast majority of people who have cataract surgery have better vision afterward. HOME CARE INSTRUCTIONS  If you are not planning surgery, you may be asked to do the following:  Use different eyeglasses.   Use stronger or brighter lighting.   Ask your eye doctor about reducing your medicine dose or  changing medicines if it is thought that a medicine caused your cataract. Changing medicines does not make the cataract go away on its own.   Become familiar with your surroundings. Poor vision can lead to injury. Avoid bumping into things on the affected side. You are at a higher risk for tripping or falling.   Exercise extreme care when driving or operating machinery.   Wear sunglasses if you are sensitive to bright light or experiencing problems with glare.  SEEK IMMEDIATE MEDICAL CARE IF:   You have a worsening or sudden vision  loss.   You notice redness, swelling, or increasing pain in the eye.   You have a fever.  Document Released: 05/04/2005 Document Revised: 04/23/2011 Document Reviewed: 12/26/2010 [facility] Patient Information 2012 [place].PATIENT INSTRUCTIONS POST-ANESTHESIA  IMMEDIATELY FOLLOWING SURGERY:  Do not drive or operate machinery for the first twenty four hours after surgery.  Do not make any important decisions for twenty four hours after surgery or while taking narcotic pain medications or sedatives.  If you develop intractable nausea and vomiting or a severe headache please notify your doctor immediately.  FOLLOW-UP:  Please make an appointment with your surgeon as instructed. You do not need to follow up with anesthesia unless specifically instructed to do so.  WOUND CARE INSTRUCTIONS (if applicable):  Keep a dry clean dressing on the anesthesia/puncture wound site if there is drainage.  Once the wound has quit draining you may leave it open to air.  Generally you should leave the bandage intact for twenty four hours unless there is drainage.  If the epidural site drains for more than 36-48 hours please call the anesthesia department.  QUESTIONS?:  Please feel free to call your physician or the hospital operator if you have any questions, and they will be happy to assist you.

## 2018-04-29 DIAGNOSIS — H25811 Combined forms of age-related cataract, right eye: Secondary | ICD-10-CM | POA: Diagnosis not present

## 2018-05-02 ENCOUNTER — Encounter (HOSPITAL_COMMUNITY)
Admission: RE | Admit: 2018-05-02 | Discharge: 2018-05-02 | Disposition: A | Discharge status: Home / Self Care | Payer: Commercial Managed Care - PPO | Source: Ambulatory Visit | Attending: Ophthalmology | Admitting: Ophthalmology

## 2018-05-02 ENCOUNTER — Other Ambulatory Visit: Payer: Self-pay

## 2018-05-02 ENCOUNTER — Encounter (HOSPITAL_COMMUNITY): Payer: Self-pay

## 2018-05-02 DIAGNOSIS — Z01818 Encounter for other preprocedural examination: Secondary | ICD-10-CM | POA: Diagnosis not present

## 2018-05-02 LAB — BASIC METABOLIC PANEL
Anion gap: 8 (ref 5–15)
BUN: 25 mg/dL — AB (ref 8–23)
CALCIUM: 9.4 mg/dL (ref 8.9–10.3)
CO2: 24 mmol/L (ref 22–32)
CREATININE: 1.19 mg/dL — AB (ref 0.44–1.00)
Chloride: 108 mmol/L (ref 98–111)
GFR calc Af Amer: 55 mL/min — ABNORMAL LOW (ref >60)
GFR calc non Af Amer: 48 mL/min — ABNORMAL LOW (ref >60)
GLUCOSE: 126 mg/dL — AB (ref 70–99)
Potassium: 4.4 mmol/L (ref 3.5–5.1)
SODIUM: 140 mmol/L (ref 135–145)

## 2018-05-02 LAB — CBC
HCT: 38.2 % (ref 36.0–46.0)
Hemoglobin: 12.5 g/dL (ref 12.0–15.0)
MCH: 33.3 pg (ref 26.0–34.0)
MCHC: 32.7 g/dL (ref 30.0–36.0)
MCV: 101.9 fL — AB (ref 80.0–100.0)
PLATELETS: 311 10*3/uL (ref 150–400)
RBC: 3.75 MIL/uL — ABNORMAL LOW (ref 3.87–5.11)
RDW: 13.4 % (ref 11.5–15.5)
WBC: 6.6 10*3/uL (ref 4.0–10.5)
nRBC: 0 % (ref 0.0–0.2)

## 2018-05-02 LAB — HEMOGLOBIN A1C
HEMOGLOBIN A1C: 6.1 % — AB (ref 4.8–5.6)
Mean Plasma Glucose: 128.37 mg/dL

## 2018-05-02 LAB — GLUCOSE, CAPILLARY: Glucose-Capillary: 111 mg/dL — ABNORMAL HIGH (ref 70–99)

## 2018-05-02 NOTE — Pre-Procedure Instructions (Signed)
No Chest X-ray needed per Dr Hilaria Ota.

## 2018-05-06 ENCOUNTER — Ambulatory Visit (HOSPITAL_COMMUNITY)
Admission: RE | Admit: 2018-05-06 | Discharge: 2018-05-06 | Disposition: A | Discharge status: Home / Self Care | Payer: Commercial Managed Care - PPO | Attending: Ophthalmology | Admitting: Ophthalmology

## 2018-05-06 ENCOUNTER — Ambulatory Visit (HOSPITAL_COMMUNITY): Payer: Commercial Managed Care - PPO | Admitting: Anesthesiology

## 2018-05-06 ENCOUNTER — Encounter (HOSPITAL_COMMUNITY)
Admission: RE | Disposition: A | Discharge status: Home / Self Care | Payer: Self-pay | Source: Home / Self Care | Attending: Ophthalmology

## 2018-05-06 ENCOUNTER — Encounter (HOSPITAL_COMMUNITY): Payer: Self-pay | Admitting: *Deleted

## 2018-05-06 DIAGNOSIS — E1136 Type 2 diabetes mellitus with diabetic cataract: Secondary | ICD-10-CM | POA: Insufficient documentation

## 2018-05-06 DIAGNOSIS — E7800 Pure hypercholesterolemia, unspecified: Secondary | ICD-10-CM | POA: Insufficient documentation

## 2018-05-06 DIAGNOSIS — H269 Unspecified cataract: Secondary | ICD-10-CM | POA: Diagnosis not present

## 2018-05-06 DIAGNOSIS — H21541 Posterior synechiae (iris), right eye: Secondary | ICD-10-CM | POA: Diagnosis not present

## 2018-05-06 DIAGNOSIS — Z87891 Personal history of nicotine dependence: Secondary | ICD-10-CM | POA: Insufficient documentation

## 2018-05-06 DIAGNOSIS — D649 Anemia, unspecified: Secondary | ICD-10-CM | POA: Diagnosis not present

## 2018-05-06 DIAGNOSIS — Z79899 Other long term (current) drug therapy: Secondary | ICD-10-CM | POA: Diagnosis not present

## 2018-05-06 DIAGNOSIS — H25811 Combined forms of age-related cataract, right eye: Secondary | ICD-10-CM | POA: Diagnosis not present

## 2018-05-06 HISTORY — PX: CATARACT EXTRACTION W/PHACO: SHX586

## 2018-05-06 LAB — GLUCOSE, CAPILLARY: Glucose-Capillary: 101 mg/dL — ABNORMAL HIGH (ref 70–99)

## 2018-05-06 SURGERY — PHACOEMULSIFICATION, CATARACT, WITH IOL INSERTION
Anesthesia: Monitor Anesthesia Care | Laterality: Right

## 2018-05-06 MED ORDER — SODIUM HYALURONATE 23 MG/ML IO SOLN
INTRAOCULAR | Status: DC | PRN
  Administered 2018-05-06: 0.6 mL via INTRAOCULAR

## 2018-05-06 MED ORDER — PROVISC 10 MG/ML IO SOLN
INTRAOCULAR | Status: DC | PRN
  Administered 2018-05-06: 0.85 mL via INTRAOCULAR

## 2018-05-06 MED ORDER — MIDAZOLAM HCL 2 MG/2ML IJ SOLN
INTRAMUSCULAR | Status: AC
  Filled 2018-05-06: qty 2

## 2018-05-06 MED ORDER — TRIAMCINOLONE ACETONIDE 40 MG/ML IO SUSP
INTRAOCULAR | Status: DC | PRN
  Administered 2018-05-06: 10 mg

## 2018-05-06 MED ORDER — EPINEPHRINE PF 1 MG/ML IJ SOLN
INTRAOCULAR | Status: DC | PRN
  Administered 2018-05-06: 500 mL

## 2018-05-06 MED ORDER — TETRACAINE HCL 0.5 % OP SOLN
1.0000 [drp] | OPHTHALMIC | Status: AC
  Administered 2018-05-06 (×3): 1 [drp] via OPHTHALMIC

## 2018-05-06 MED ORDER — LIDOCAINE HCL (PF) 1 % IJ SOLN
INTRAOCULAR | Status: DC | PRN
  Administered 2018-05-06: 1 mL via OPHTHALMIC

## 2018-05-06 MED ORDER — LIDOCAINE HCL 3.5 % OP GEL
1.0000 "application " | Freq: Once | OPHTHALMIC | Status: AC
  Administered 2018-05-06: 1 via OPHTHALMIC

## 2018-05-06 MED ORDER — PHENYLEPHRINE HCL 2.5 % OP SOLN
1.0000 [drp] | OPHTHALMIC | Status: AC
  Administered 2018-05-06 (×3): 1 [drp] via OPHTHALMIC

## 2018-05-06 MED ORDER — NEOMYCIN-POLYMYXIN-DEXAMETH 3.5-10000-0.1 OP SUSP
OPHTHALMIC | Status: DC | PRN
  Administered 2018-05-06: 2 [drp] via OPHTHALMIC

## 2018-05-06 MED ORDER — MIDAZOLAM HCL 5 MG/5ML IJ SOLN
INTRAMUSCULAR | Status: DC | PRN
  Administered 2018-05-06 (×2): 1 mg via INTRAVENOUS

## 2018-05-06 MED ORDER — BSS IO SOLN
INTRAOCULAR | Status: DC | PRN
  Administered 2018-05-06: 15 mL

## 2018-05-06 MED ORDER — CYCLOPENTOLATE-PHENYLEPHRINE 0.2-1 % OP SOLN
1.0000 [drp] | OPHTHALMIC | Status: AC
  Administered 2018-05-06 (×3): 1 [drp] via OPHTHALMIC

## 2018-05-06 MED ORDER — POVIDONE-IODINE 5 % OP SOLN
OPHTHALMIC | Status: DC | PRN
  Administered 2018-05-06: 1 via OPHTHALMIC

## 2018-05-06 SURGICAL SUPPLY — 13 items

## 2018-05-06 NOTE — Anesthesia Procedure Notes (Signed)
Date/Time: 05/06/2018 8:37 AM Performed by: Talbot Grumbling, CRNA Oxygen Delivery Method: Nasal cannula

## 2018-05-06 NOTE — Transfer of Care (Signed)
Immediate Anesthesia Transfer of Care Note  Patient: Kristen Mcknight  Procedure(s) Performed: CATARACT EXTRACTION PHACO AND INTRAOCULAR LENS PLACEMENT RIGHT EYE (Right )  Patient Location: PACU  Anesthesia Type:MAC  Level of Consciousness: awake, alert  and oriented  Airway & Oxygen Therapy: Patient Spontanous Breathing  Post-op Assessment: Report given to RN and Post -op Vital signs reviewed and stable  Post vital signs: Reviewed and stable  Last Vitals:  Vitals Value Taken Time  BP    Temp    Pulse    Resp    SpO2      Last Pain:  Vitals:   05/06/18 0823  TempSrc: Oral  PainSc: 0-No pain      Patients Stated Pain Goal: 8 (71/06/26 9485)  Complications: No apparent anesthesia complications

## 2018-05-06 NOTE — Anesthesia Postprocedure Evaluation (Signed)
Anesthesia Post Note  Patient: Kristen Mcknight  Procedure(s) Performed: CATARACT EXTRACTION PHACO AND INTRAOCULAR LENS PLACEMENT RIGHT EYE (Right )  Patient location during evaluation: PACU Anesthesia Type: MAC Level of consciousness: awake and alert Pain management: pain level controlled Vital Signs Assessment: post-procedure vital signs reviewed and stable Respiratory status: spontaneous breathing Cardiovascular status: blood pressure returned to baseline Postop Assessment: no apparent nausea or vomiting Anesthetic complications: no     Last Vitals:  Vitals:   05/06/18 0823  BP: (!) 148/72  Pulse: (!) 56  Resp: 18  Temp: 36.7 C  SpO2: 100%    Last Pain:  Vitals:   05/06/18 0823  TempSrc: Oral  PainSc: 0-No pain                 Jalexia Lalli

## 2018-05-06 NOTE — Anesthesia Preprocedure Evaluation (Signed)
Anesthesia Evaluation  Patient identified by MRN, date of birth, ID band Patient awake    Reviewed: Allergy & Precautions, H&P , NPO status , Patient's Chart, lab work & pertinent test results  Airway Mallampati: II  TM Distance: >3 FB Neck ROM: full    Dental no notable dental hx.    Pulmonary neg pulmonary ROS, former smoker,    Pulmonary exam normal breath sounds clear to auscultation       Cardiovascular Exercise Tolerance: Good negative cardio ROS   Rhythm:regular Rate:Normal     Neuro/Psych negative neurological ROS  negative psych ROS   GI/Hepatic negative GI ROS, (+) Hepatitis -, C  Endo/Other  negative endocrine ROSdiabetes  Renal/GU Renal disease  negative genitourinary   Musculoskeletal   Abdominal   Peds  Hematology  (+) Blood dyscrasia, anemia ,   Anesthesia Other Findings   Reproductive/Obstetrics negative OB ROS                             Anesthesia Physical Anesthesia Plan  ASA: III  Anesthesia Plan: MAC   Post-op Pain Management:    Induction:   PONV Risk Score and Plan:   Airway Management Planned:   Additional Equipment:   Intra-op Plan:   Post-operative Plan:   Informed Consent: I have reviewed the patients History and Physical, chart, labs and discussed the procedure including the risks, benefits and alternatives for the proposed anesthesia with the patient or authorized representative who has indicated his/her understanding and acceptance.     Plan Discussed with: CRNA  Anesthesia Plan Comments:         Anesthesia Quick Evaluation

## 2018-05-06 NOTE — Discharge Instructions (Signed)
Please discharge patient when stable, will follow up today with Dr. Aleni Andrus at the [facility] office immediately following discharge.  Leave shield in place until visit.  All paperwork with discharge instructions will be given at the office. ° ° °Moderate Conscious Sedation, Adult, Care After °These instructions provide you with information about caring for yourself after your procedure. Your health care provider may also give you more specific instructions. Your treatment has been planned according to current medical practices, but problems sometimes occur. Call your health care provider if you have any problems or questions after your procedure. °What can I expect after the procedure? °After your procedure, it is common: °· To feel sleepy for several hours. °· To feel clumsy and have poor balance for several hours. °· To have poor judgment for several hours. °· To vomit if you eat too soon. °Follow these instructions at home: °For at least 24 hours after the procedure: ° °· Do not: °? Participate in activities where you could fall or become injured. °? Drive. °? Use heavy machinery. °? Drink alcohol. °? Take sleeping pills or medicines that cause drowsiness. °? Make important decisions or sign legal documents. °? Take care of children on your own. °· Rest. °Eating and drinking °· Follow the diet recommended by your health care provider. °· If you vomit: °? Drink water, juice, or soup when you can drink without vomiting. °? Make sure you have little or no nausea before eating solid foods. °General instructions °· Have a responsible adult stay with you until you are awake and alert. °· Take over-the-counter and prescription medicines only as told by your health care provider. °· If you smoke, do not smoke without supervision. °· Keep all follow-up visits as told by your health care provider. This is important. °Contact a health care provider if: °· You keep feeling nauseous or you keep vomiting. °· You feel  light-headed. °· You develop a rash. °· You have a fever. °Get help right away if: °· You have trouble breathing. °This information is not intended to replace advice given to you by your health care provider. Make sure you discuss any questions you have with your health care provider. °Document Released: 02/22/2013 Document Revised: 10/07/2015 Document Reviewed: 08/24/2015 °Elsevier Interactive Patient Education © 2019 [employer] ° °

## 2018-05-06 NOTE — H&P (Signed)
The H and P was reviewed and updated. The patient was examined.  No changes were found after exam.  The surgical eye was marked.  

## 2018-05-06 NOTE — Op Note (Signed)
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  +23.5D into the capsular bag of the Right Eye [id number])  2) Lysis of posterior synechiae, Right Eye  Attending surgeon: Gerda Diss. Jara Feider, MD, MA  Anesthesia: MAC, Topical [place]  Complications: None  Estimated Blood Loss: <58m (minimal)  Specimens: None  Implants: As above  Indications:  Visually significant cataract, Right Eye; Posterior synechiae, Right Eye  Procedure:  The patient was seen and identified in the pre-operative area. The operative eye was identified and dilated.  The operative eye was marked.  Topical anesthesia was administered to the operative eye.     The patient was then to the operative suite and placed in the supine position.  A timeout was performed confirming the patient, procedure to be performed, and all other relevant information.   The patient's face was prepped and draped in the usual fashion for intra-ocular surgery.  A lid speculum was placed into the operative eye and the surgical microscope moved into place and focused.  A superotemporal paracentesis was created using a 20 gauge paracentesis blade.  Shugarcaine was injected into the anterior chamber.  Viscoelastic was injected into the anterior chamber.  The inferonasal extensive posterior synechiae were carefully lysed from the anterior lens capsule.  A temporal clear-corneal main wound incision was created using a 2.424mmicrokeratome.  A continuous curvilinear capsulorrhexis was initiated using an irrigating cystitome and completed using capsulorrhexis forceps.  Hydrodissection and hydrodeliniation were performed.   Viscoelastic was injected into the anterior chamber.  A phacoemulsification handpiece and a chopper as a second instrument were used to remove the nucleus and epinucleus. The irrigation/aspiration handpiece was used to remove any remaining cortical material.   The capsular bag was reinflated with viscoelastic, checked, and found to be intact.  The intraocular lens was inserted into the capsular bag and dialed into place using a Kuglen hook.  The irrigation/aspiration handpiece was used to remove any remaining viscoelastic.  The clear corneal wound and paracentesis wounds were then hydrated and checked with Weck-Cels to be watertight.  Triesence was injected into the anterior chamber.  The lid-speculum and drape was removed, and the patient's face was cleaned with a wet and dry 4x4.  Maxitrol was instilled in the eye before a clear shield was taped over the eye. The patient was taken to the post-operative care unit in good condition, having tolerated the procedure well.  Post-Op Instructions: [facility] and will receive all other orders and instructions.

## 2018-05-09 ENCOUNTER — Encounter (HOSPITAL_COMMUNITY): Payer: Self-pay | Admitting: Ophthalmology

## 2018-06-01 DIAGNOSIS — Z5181 Encounter for therapeutic drug level monitoring: Secondary | ICD-10-CM | POA: Diagnosis not present

## 2018-06-01 DIAGNOSIS — H30033 Focal chorioretinal inflammation, peripheral, bilateral: Secondary | ICD-10-CM | POA: Diagnosis not present

## 2018-06-07 DIAGNOSIS — H30031 Focal chorioretinal inflammation, peripheral, right eye: Secondary | ICD-10-CM | POA: Diagnosis not present

## 2018-06-07 DIAGNOSIS — Z961 Presence of intraocular lens: Secondary | ICD-10-CM | POA: Diagnosis not present

## 2018-06-07 DIAGNOSIS — Z79899 Other long term (current) drug therapy: Secondary | ICD-10-CM | POA: Diagnosis not present

## 2018-06-07 DIAGNOSIS — H21541 Posterior synechiae (iris), right eye: Secondary | ICD-10-CM | POA: Diagnosis not present

## 2018-07-13 DIAGNOSIS — N183 Chronic kidney disease, stage 3 (moderate): Secondary | ICD-10-CM | POA: Diagnosis not present

## 2018-07-13 DIAGNOSIS — I129 Hypertensive chronic kidney disease with stage 1 through stage 4 chronic kidney disease, or unspecified chronic kidney disease: Secondary | ICD-10-CM | POA: Diagnosis not present

## 2018-07-13 DIAGNOSIS — R809 Proteinuria, unspecified: Secondary | ICD-10-CM | POA: Diagnosis not present

## 2018-07-13 DIAGNOSIS — E559 Vitamin D deficiency, unspecified: Secondary | ICD-10-CM | POA: Diagnosis not present

## 2018-08-04 DIAGNOSIS — Z6827 Body mass index (BMI) 27.0-27.9, adult: Secondary | ICD-10-CM | POA: Diagnosis not present

## 2018-08-04 DIAGNOSIS — J309 Allergic rhinitis, unspecified: Secondary | ICD-10-CM | POA: Diagnosis not present

## 2018-08-08 DIAGNOSIS — Z1231 Encounter for screening mammogram for malignant neoplasm of breast: Secondary | ICD-10-CM | POA: Diagnosis not present

## 2018-08-30 DIAGNOSIS — H21541 Posterior synechiae (iris), right eye: Secondary | ICD-10-CM | POA: Diagnosis not present

## 2018-08-30 DIAGNOSIS — Z961 Presence of intraocular lens: Secondary | ICD-10-CM | POA: Diagnosis not present

## 2018-08-30 DIAGNOSIS — Z79899 Other long term (current) drug therapy: Secondary | ICD-10-CM | POA: Diagnosis not present

## 2018-08-30 DIAGNOSIS — Z9841 Cataract extraction status, right eye: Secondary | ICD-10-CM | POA: Diagnosis not present

## 2018-08-30 DIAGNOSIS — H30031 Focal chorioretinal inflammation, peripheral, right eye: Secondary | ICD-10-CM | POA: Diagnosis not present

## 2018-10-25 DIAGNOSIS — E7800 Pure hypercholesterolemia, unspecified: Secondary | ICD-10-CM | POA: Diagnosis not present

## 2018-10-25 DIAGNOSIS — I10 Essential (primary) hypertension: Secondary | ICD-10-CM | POA: Diagnosis not present

## 2018-10-25 DIAGNOSIS — E1165 Type 2 diabetes mellitus with hyperglycemia: Secondary | ICD-10-CM | POA: Diagnosis not present

## 2018-10-25 DIAGNOSIS — K210 Gastro-esophageal reflux disease with esophagitis: Secondary | ICD-10-CM | POA: Diagnosis not present

## 2018-10-25 DIAGNOSIS — H30033 Focal chorioretinal inflammation, peripheral, bilateral: Secondary | ICD-10-CM | POA: Diagnosis not present

## 2018-10-25 DIAGNOSIS — Z5181 Encounter for therapeutic drug level monitoring: Secondary | ICD-10-CM | POA: Diagnosis not present

## 2018-11-01 DIAGNOSIS — H30031 Focal chorioretinal inflammation, peripheral, right eye: Secondary | ICD-10-CM | POA: Diagnosis not present

## 2018-11-01 DIAGNOSIS — Z961 Presence of intraocular lens: Secondary | ICD-10-CM | POA: Diagnosis not present

## 2018-11-01 DIAGNOSIS — H21541 Posterior synechiae (iris), right eye: Secondary | ICD-10-CM | POA: Diagnosis not present

## 2018-11-01 DIAGNOSIS — Z79899 Other long term (current) drug therapy: Secondary | ICD-10-CM | POA: Diagnosis not present

## 2018-11-11 DIAGNOSIS — E1165 Type 2 diabetes mellitus with hyperglycemia: Secondary | ICD-10-CM | POA: Diagnosis not present

## 2018-11-11 DIAGNOSIS — H44131 Sympathetic uveitis, right eye: Secondary | ICD-10-CM | POA: Diagnosis not present

## 2018-11-11 DIAGNOSIS — E7800 Pure hypercholesterolemia, unspecified: Secondary | ICD-10-CM | POA: Diagnosis not present

## 2018-11-11 DIAGNOSIS — I10 Essential (primary) hypertension: Secondary | ICD-10-CM | POA: Diagnosis not present

## 2018-11-11 DIAGNOSIS — D860 Sarcoidosis of lung: Secondary | ICD-10-CM | POA: Diagnosis not present

## 2018-11-11 DIAGNOSIS — N179 Acute kidney failure, unspecified: Secondary | ICD-10-CM | POA: Diagnosis not present

## 2018-11-11 DIAGNOSIS — Z6827 Body mass index (BMI) 27.0-27.9, adult: Secondary | ICD-10-CM | POA: Diagnosis not present

## 2018-11-11 DIAGNOSIS — Z0000 Encounter for general adult medical examination without abnormal findings: Secondary | ICD-10-CM | POA: Diagnosis not present

## 2018-12-07 DIAGNOSIS — D235 Other benign neoplasm of skin of trunk: Secondary | ICD-10-CM | POA: Diagnosis not present

## 2018-12-07 DIAGNOSIS — D239 Other benign neoplasm of skin, unspecified: Secondary | ICD-10-CM | POA: Diagnosis not present

## 2018-12-07 DIAGNOSIS — L810 Postinflammatory hyperpigmentation: Secondary | ICD-10-CM | POA: Diagnosis not present

## 2019-02-10 DIAGNOSIS — N182 Chronic kidney disease, stage 2 (mild): Secondary | ICD-10-CM | POA: Diagnosis not present

## 2019-02-10 DIAGNOSIS — E1129 Type 2 diabetes mellitus with other diabetic kidney complication: Secondary | ICD-10-CM | POA: Diagnosis not present

## 2019-02-10 DIAGNOSIS — R809 Proteinuria, unspecified: Secondary | ICD-10-CM | POA: Diagnosis not present

## 2019-02-10 DIAGNOSIS — D649 Anemia, unspecified: Secondary | ICD-10-CM | POA: Diagnosis not present

## 2019-02-10 DIAGNOSIS — I129 Hypertensive chronic kidney disease with stage 1 through stage 4 chronic kidney disease, or unspecified chronic kidney disease: Secondary | ICD-10-CM | POA: Diagnosis not present

## 2019-02-10 DIAGNOSIS — Z79899 Other long term (current) drug therapy: Secondary | ICD-10-CM | POA: Diagnosis not present

## 2019-02-22 DIAGNOSIS — Z5181 Encounter for therapeutic drug level monitoring: Secondary | ICD-10-CM | POA: Diagnosis not present

## 2019-02-22 DIAGNOSIS — H30033 Focal chorioretinal inflammation, peripheral, bilateral: Secondary | ICD-10-CM | POA: Diagnosis not present

## 2019-02-28 DIAGNOSIS — H21541 Posterior synechiae (iris), right eye: Secondary | ICD-10-CM | POA: Diagnosis not present

## 2019-02-28 DIAGNOSIS — Z79899 Other long term (current) drug therapy: Secondary | ICD-10-CM | POA: Diagnosis not present

## 2019-02-28 DIAGNOSIS — H30031 Focal chorioretinal inflammation, peripheral, right eye: Secondary | ICD-10-CM | POA: Diagnosis not present

## 2019-02-28 DIAGNOSIS — Z961 Presence of intraocular lens: Secondary | ICD-10-CM | POA: Diagnosis not present

## 2020-05-14 DIAGNOSIS — Z961 Presence of intraocular lens: Secondary | ICD-10-CM | POA: Diagnosis not present

## 2020-05-14 DIAGNOSIS — H21541 Posterior synechiae (iris), right eye: Secondary | ICD-10-CM | POA: Diagnosis not present

## 2020-05-14 DIAGNOSIS — H30031 Focal chorioretinal inflammation, peripheral, right eye: Secondary | ICD-10-CM | POA: Diagnosis not present

## 2020-05-14 DIAGNOSIS — Z79899 Other long term (current) drug therapy: Secondary | ICD-10-CM | POA: Diagnosis not present

## 2020-05-20 DIAGNOSIS — I10 Essential (primary) hypertension: Secondary | ICD-10-CM | POA: Diagnosis not present

## 2020-05-20 DIAGNOSIS — E782 Mixed hyperlipidemia: Secondary | ICD-10-CM | POA: Diagnosis not present

## 2020-05-20 DIAGNOSIS — R0600 Dyspnea, unspecified: Secondary | ICD-10-CM | POA: Diagnosis not present

## 2020-05-20 DIAGNOSIS — R0789 Other chest pain: Secondary | ICD-10-CM | POA: Diagnosis not present

## 2020-05-20 DIAGNOSIS — R002 Palpitations: Secondary | ICD-10-CM | POA: Diagnosis not present

## 2020-05-20 DIAGNOSIS — R9431 Abnormal electrocardiogram [ECG] [EKG]: Secondary | ICD-10-CM | POA: Diagnosis not present

## 2020-05-28 DIAGNOSIS — Z0389 Encounter for observation for other suspected diseases and conditions ruled out: Secondary | ICD-10-CM | POA: Diagnosis not present

## 2020-05-29 DIAGNOSIS — E1122 Type 2 diabetes mellitus with diabetic chronic kidney disease: Secondary | ICD-10-CM | POA: Diagnosis not present

## 2020-05-29 DIAGNOSIS — Z6829 Body mass index (BMI) 29.0-29.9, adult: Secondary | ICD-10-CM | POA: Diagnosis not present

## 2020-05-29 DIAGNOSIS — I129 Hypertensive chronic kidney disease with stage 1 through stage 4 chronic kidney disease, or unspecified chronic kidney disease: Secondary | ICD-10-CM | POA: Diagnosis not present

## 2020-05-29 DIAGNOSIS — E1129 Type 2 diabetes mellitus with other diabetic kidney complication: Secondary | ICD-10-CM | POA: Diagnosis not present

## 2020-05-29 DIAGNOSIS — N189 Chronic kidney disease, unspecified: Secondary | ICD-10-CM | POA: Diagnosis not present

## 2020-05-29 DIAGNOSIS — R809 Proteinuria, unspecified: Secondary | ICD-10-CM | POA: Diagnosis not present

## 2020-11-22 DIAGNOSIS — E1165 Type 2 diabetes mellitus with hyperglycemia: Secondary | ICD-10-CM | POA: Diagnosis not present

## 2020-11-22 DIAGNOSIS — N189 Chronic kidney disease, unspecified: Secondary | ICD-10-CM | POA: Diagnosis not present

## 2020-11-22 DIAGNOSIS — R809 Proteinuria, unspecified: Secondary | ICD-10-CM | POA: Diagnosis not present

## 2020-11-22 DIAGNOSIS — E785 Hyperlipidemia, unspecified: Secondary | ICD-10-CM | POA: Diagnosis not present

## 2020-11-22 DIAGNOSIS — I10 Essential (primary) hypertension: Secondary | ICD-10-CM | POA: Diagnosis not present

## 2020-11-22 DIAGNOSIS — I129 Hypertensive chronic kidney disease with stage 1 through stage 4 chronic kidney disease, or unspecified chronic kidney disease: Secondary | ICD-10-CM | POA: Diagnosis not present

## 2020-11-27 ENCOUNTER — Other Ambulatory Visit: Payer: Self-pay

## 2020-11-27 ENCOUNTER — Ambulatory Visit: Payer: Self-pay

## 2020-11-27 ENCOUNTER — Encounter: Payer: Self-pay | Admitting: Orthopaedic Surgery

## 2020-11-27 ENCOUNTER — Ambulatory Visit (INDEPENDENT_AMBULATORY_CARE_PROVIDER_SITE_OTHER): Payer: Medicare Other | Admitting: Orthopaedic Surgery

## 2020-11-27 VITALS — Ht 66.0 in | Wt 178.0 lb

## 2020-11-27 DIAGNOSIS — E1129 Type 2 diabetes mellitus with other diabetic kidney complication: Secondary | ICD-10-CM | POA: Diagnosis not present

## 2020-11-27 DIAGNOSIS — M545 Low back pain, unspecified: Secondary | ICD-10-CM

## 2020-11-27 DIAGNOSIS — E875 Hyperkalemia: Secondary | ICD-10-CM | POA: Diagnosis not present

## 2020-11-27 DIAGNOSIS — M7712 Lateral epicondylitis, left elbow: Secondary | ICD-10-CM | POA: Diagnosis not present

## 2020-11-27 DIAGNOSIS — R809 Proteinuria, unspecified: Secondary | ICD-10-CM | POA: Diagnosis not present

## 2020-11-27 DIAGNOSIS — N189 Chronic kidney disease, unspecified: Secondary | ICD-10-CM | POA: Diagnosis not present

## 2020-11-27 DIAGNOSIS — M7710 Lateral epicondylitis, unspecified elbow: Secondary | ICD-10-CM | POA: Insufficient documentation

## 2020-11-27 DIAGNOSIS — E1122 Type 2 diabetes mellitus with diabetic chronic kidney disease: Secondary | ICD-10-CM | POA: Diagnosis not present

## 2020-11-27 DIAGNOSIS — M25552 Pain in left hip: Secondary | ICD-10-CM | POA: Diagnosis not present

## 2020-11-27 DIAGNOSIS — G8929 Other chronic pain: Secondary | ICD-10-CM | POA: Diagnosis not present

## 2020-11-27 DIAGNOSIS — I129 Hypertensive chronic kidney disease with stage 1 through stage 4 chronic kidney disease, or unspecified chronic kidney disease: Secondary | ICD-10-CM | POA: Diagnosis not present

## 2020-11-27 MED ORDER — BUPIVACAINE HCL 0.25 % IJ SOLN
2.0000 mL | INTRAMUSCULAR | Status: AC | PRN
  Administered 2020-11-27: 2 mL

## 2020-11-27 NOTE — Progress Notes (Signed)
Office Visit Note   Patient: Kristen Mcknight           Date of Birth: 1950-11-27           MRN: [id number] Visit Date: 11/27/2020              Requested by: Sasser, Silvestre Moment, MD [place],  [place] [address] PCP: Manon Hilding, MD   Assessment & Plan: Visit Diagnoses:  1. Chronic left-sided low back pain, unspecified whether sciatica present   2. Lateral epicondylitis of left elbow   3. Pain in left hip     Plan: Recurrent lateral epicondylitis left elbow.  Has had 3 prior injections and not interested in surgery.  Last injection was about 3 months ago performed elsewhere with not much relief.  Will reinject elbow today.  Also having some pain in the area of the left buttock and lateral left hip.  Exam was benign.  Neurologically intact.  Straight leg raise negative.  Could be referred from her back.  I discussed back exercises if no improvement will return  Follow-Up Instructions: Return if symptoms worsen or fail to improve.   Orders:  Orders Placed This Encounter  Procedures   Hand/UE Inj   XR Lumbar Spine 2-3 Views   XR Elbow 2 Views Left   No orders of the defined types were placed in this encounter.     Procedures: Hand/UE Inj for lateral epicondylitis on 11/27/2020 3:31 PM Indications: pain Details: 25 G needle Medications: 2 mL bupivacaine 0.25 %  6 mg betamethasone injected with Marcaine over the lateral epicondyles left elbow     Clinical Data: No additional findings.   Subjective: Chief Complaint  Patient presents with   Left Elbow - Pain   Lower Back - Pain  Patient presents today for left elbow and lower back pain. She said that she has had tennis elbow in the past and received cortisone injections that have helped. She also has complaints of lower back pain that radiates down her left leg. She said that her back pain started one month ago. She takes Aleve as needed.   HPI  Review of Systems   Objective: Vital Signs: Ht 5\' 6"  (1.676 m)    Wt 178 lb (80.7 kg)   BMI 28.73 kg/m   Physical Exam Constitutional:      Appearance: She is well-developed.  Eyes:     Pupils: Pupils are equal, round, and reactive to light.  Pulmonary:     Effort: Pulmonary effort is normal.  Skin:    General: Skin is warm and dry.  Neurological:     Mental Status: She is alert and oriented to person, place, and time.  Psychiatric:        Behavior: Behavior normal.    Ortho Exam left elbow with full range of motion in pronation supination flexion extension but local tenderness over the lateral epicondyles.  Positive pain with grip mostly in extension.  Neurologically intact.  Straight leg raise negative.  Painless range of motion of both hips.  No pain in the left buttock or ischium or over the lateral aspect of the greater trochanter.  No distal edema.  Reflexes symmetrical.  Neurologically intact.  No percussible tenderness lumbar spine  Specialty Comments:  No specialty comments available.  Imaging: XR Lumbar Spine 2-3 Views  Result Date: 11/27/2020 Films of the lumbar spine obtained in 2 projections.  There is some mild calcification abdominal aorta without obvious aneurysmal dilatation.  No listhesis.  Facet sclerosis at L4-5 and L5-S1.  Mild degenerative disc disease at L5-S1 but space still remaining.  No evidence of a compression fracture.  No scoliosis.  Films are consistent with some mild osteoarthritis    PMFS History: Patient Active Problem List   Diagnosis Date Noted   Tennis elbow 11/27/2020   Pain in left hip 11/27/2020   Uveitis of right eye 09/15/2017   Encounter for screening colonoscopy 07/30/2017   Pilar Plate hematuria    Acute blood loss anemia    Acute renal failure (ARF) ([place]) 05/09/2015   Acute interstitial nephritis 05/09/2015   Hep C w/o coma, chronic ([place]) 05/09/2015   Sarcoidosis 05/09/2015   HLD (hyperlipidemia) 05/09/2015   Past Medical History:  Diagnosis Date   Anemia    Chronic kidney disease    DM  (diabetes mellitus) (HCC)     x 1 year   Hepatitis    Hyperlipidemia    Sarcoidosis     Family History  Problem Relation Age of Onset   Asthma Mother    CAD Mother    Lung cancer Father    Lupus Other     Past Surgical History:  Procedure Laterality Date   ABDOMINAL HYSTERECTOMY     CATARACT EXTRACTION W/PHACO Right 05/06/2018   Procedure: CATARACT EXTRACTION PHACO AND INTRAOCULAR LENS PLACEMENT RIGHT EYE;  Surgeon: Baruch Goldmann, MD;  Location: [facility];  Service: Ophthalmology;  Laterality: Right;  right   COLONOSCOPY N/A 09/15/2017   Procedure: COLONOSCOPY;  Surgeon: Rogene Houston, MD;  Location: [facility] SUITE;  Service: Endoscopy;  Laterality: N/A;  8:30   POLYPECTOMY  09/15/2017   Procedure: POLYPECTOMY;  Surgeon: Rogene Houston, MD;  Location: [facility];  Service: Endoscopy;;   Social History   Occupational History   Not on file  Tobacco Use   Smoking status: Former    Packs/day: 0.25    Years: 6.00    Pack years: 1.50    Types: Cigarettes    Quit date: 05/08/2014    Years since quitting: 6.5   Smokeless tobacco: Never  Vaping Use   Vaping Use: Never used  Substance and Sexual Activity   Alcohol use: No    Alcohol/week: 3.0 standard drinks    Types: 3 Glasses of wine per week    Comment: Quit 6 months ago. Drank 2 glasses wine a week.    Drug use: No    Comment: No hx of    Sexual activity: Yes    Birth control/protection: None

## 2020-12-03 DIAGNOSIS — E875 Hyperkalemia: Secondary | ICD-10-CM | POA: Diagnosis not present

## 2020-12-11 DIAGNOSIS — E7849 Other hyperlipidemia: Secondary | ICD-10-CM | POA: Diagnosis not present

## 2020-12-11 DIAGNOSIS — D860 Sarcoidosis of lung: Secondary | ICD-10-CM | POA: Diagnosis not present

## 2020-12-11 DIAGNOSIS — I10 Essential (primary) hypertension: Secondary | ICD-10-CM | POA: Diagnosis not present

## 2020-12-11 DIAGNOSIS — Z1389 Encounter for screening for other disorder: Secondary | ICD-10-CM | POA: Diagnosis not present

## 2020-12-11 DIAGNOSIS — E1165 Type 2 diabetes mellitus with hyperglycemia: Secondary | ICD-10-CM | POA: Diagnosis not present

## 2020-12-11 DIAGNOSIS — H44131 Sympathetic uveitis, right eye: Secondary | ICD-10-CM | POA: Diagnosis not present

## 2020-12-11 DIAGNOSIS — K2100 Gastro-esophageal reflux disease with esophagitis, without bleeding: Secondary | ICD-10-CM | POA: Diagnosis not present

## 2020-12-18 ENCOUNTER — Encounter: Payer: Self-pay | Admitting: Orthopaedic Surgery

## 2020-12-18 ENCOUNTER — Other Ambulatory Visit: Payer: Self-pay

## 2020-12-18 ENCOUNTER — Ambulatory Visit (INDEPENDENT_AMBULATORY_CARE_PROVIDER_SITE_OTHER): Payer: Medicare Other | Admitting: Orthopaedic Surgery

## 2020-12-18 ENCOUNTER — Ambulatory Visit: Payer: Self-pay

## 2020-12-18 VITALS — Ht 66.0 in | Wt 177.0 lb

## 2020-12-18 DIAGNOSIS — M79604 Pain in right leg: Secondary | ICD-10-CM | POA: Diagnosis not present

## 2020-12-18 DIAGNOSIS — M545 Low back pain, unspecified: Secondary | ICD-10-CM | POA: Diagnosis not present

## 2020-12-18 NOTE — Progress Notes (Signed)
Dg  

## 2020-12-18 NOTE — Progress Notes (Signed)
My hip hurts on the right.  She has been seen by Dr. Durward Fortes for lower back pain on 11-27-20.  I have reviewed the notes.  She cannot take NSAIDs secondary to kidney disease.  She has seen Dr. Quintin Alto recently also.  She has pain radiating to the right hip and right posterior thigh.  The right hip has been more of a pain recently.  She has no trauma.  She has no weakness.  She has no swelling or bowel or bladder problems.  ROM of the lumbar spine is good.  She has no spasm.  DTRs are normal.  SLR is negative.  Muscle strength and tone are normal.  ROM of the right hip is full. She complains of pain in full external rotation.  Gait is normal.  X-rays were done of the right hip, reported separately.  Encounter Diagnoses  Name Primary?   Pain in right leg Yes   Lumbar pain with radiation down right leg    I will give prednisone dose pack.  Precautions of the medicine were given.  Return in one week, see Dr. Durward Fortes.  Call if any problem.  Precautions discussed.  Electronically Signed Sanjuana Kava, MD 8/3/20229:43 AM

## 2020-12-19 ENCOUNTER — Telehealth: Payer: Self-pay

## 2020-12-19 MED ORDER — PREDNISONE 10 MG (21) PO TBPK: ORAL_TABLET | ORAL | 1 refills | Status: DC

## 2020-12-19 NOTE — Telephone Encounter (Signed)
Patient called stating her pharmacy never received the prednisone dose pak you were going to send in yesterday.   PATIENT USES [facility]

## 2020-12-24 DIAGNOSIS — Z961 Presence of intraocular lens: Secondary | ICD-10-CM | POA: Diagnosis not present

## 2020-12-24 DIAGNOSIS — Z79899 Other long term (current) drug therapy: Secondary | ICD-10-CM | POA: Diagnosis not present

## 2020-12-24 DIAGNOSIS — H21541 Posterior synechiae (iris), right eye: Secondary | ICD-10-CM | POA: Diagnosis not present

## 2020-12-24 DIAGNOSIS — H30031 Focal chorioretinal inflammation, peripheral, right eye: Secondary | ICD-10-CM | POA: Diagnosis not present

## 2020-12-25 ENCOUNTER — Ambulatory Visit: Payer: Medicare Other | Admitting: Orthopaedic Surgery

## 2020-12-25 ENCOUNTER — Other Ambulatory Visit: Payer: Self-pay

## 2021-01-22 ENCOUNTER — Other Ambulatory Visit: Payer: Self-pay

## 2021-01-22 ENCOUNTER — Encounter: Payer: Self-pay | Admitting: Orthopaedic Surgery

## 2021-01-22 ENCOUNTER — Ambulatory Visit (INDEPENDENT_AMBULATORY_CARE_PROVIDER_SITE_OTHER): Payer: Medicare Other | Admitting: Orthopaedic Surgery

## 2021-01-22 DIAGNOSIS — M79604 Pain in right leg: Secondary | ICD-10-CM

## 2021-01-22 DIAGNOSIS — G8929 Other chronic pain: Secondary | ICD-10-CM | POA: Diagnosis not present

## 2021-01-22 DIAGNOSIS — M545 Low back pain, unspecified: Secondary | ICD-10-CM | POA: Diagnosis not present

## 2021-01-22 NOTE — Progress Notes (Signed)
Office Visit Note   Patient: Kristen Mcknight           Date of Birth: 1950-12-03           MRN: [id number] Visit Date: 01/22/2021              Requested by: Sasser, Silvestre Moment, MD [place],  [place] [address] PCP: Manon Hilding, MD   Assessment & Plan: Visit Diagnoses:  1. Pain in right leg   2. Lumbar pain with radiation down right leg   3. Chronic bilateral low back pain without sciatica     Plan: [name] has been seen several times over the past several months for nondescript pain involving her back buttock and groin.  X-rays of her lumbar spine reveals some degenerative changes.  She also had some mild degenerative change of her right hip.  She saw Dr. Luna Glasgow about a month ago placed her on a Medrol Dosepak and that seem to make a little bit of a difference for short time.  She is diabetic and is on Glucotrol and relates that she has had some burning in tingling in her toes but her primary care physician did not think she had true diabetic neuropathy.  She has difficulty localizing her pain but seems to be worse when she is on her feet.  She is able to walk around her block about a mile and not having too much trouble but when she does she will have some mild back discomfort even in her buttock and both groin.  She might have several problems and that she has some mild arthritis of her right hip but I think she may be having some referred pain from her back and will order an MRI scan.  Future studies would include an MRI scan of her pelvis and possibly even EMGs and nerve conduction studies  Follow-Up Instructions: Return After MRI scan lumbar spine.   Orders:  Orders Placed This Encounter  Procedures   MR Lumbar Spine w/o contrast   No orders of the defined types were placed in this encounter.     Procedures: No procedures performed   Clinical Data: No additional findings.   Subjective: Chief Complaint  Patient presents with   Right Leg - Follow-up  Patient  presents today for a one month follow up on her right. She saw Dr.Keeling a month ago and was given a prednisone pack. She has noticed some improvement.  Pain is somewhat nondescript and difficult to localize but she has had some component of back and buttock pain bilaterally and even some right groin discomfort.  On occasion she has some tingling into her toes.  She is diabetic and is also on methotrexate for uveitis.  HPI  Review of Systems   Objective: Vital Signs: There were no vitals taken for this visit.  Physical Exam Constitutional:      Appearance: She is well-developed.  Eyes:     Pupils: Pupils are equal, round, and reactive to light.  Pulmonary:     Effort: Pulmonary effort is normal.  Skin:    General: Skin is warm and dry.  Neurological:     Mental Status: She is alert and oriented to person, place, and time.  Psychiatric:        Behavior: Behavior normal.    Ortho Exam comfortable sitting in no pain.  She had a little bit of discomfort with internal extra rotation of her right hip compared to her left.  X-rays do demonstrate some mild degenerative change of her right hip.  Motor exam appeared to be intact she had good sensation and good capillary refill to the toes.  No percussible tenderness of lumbar spine and straight leg raise was negative.  No localized areas of tenderness in either buttock or over either greater trochanter  Specialty Comments:  No specialty comments available.  Imaging: No results found.   PMFS History: Patient Active Problem List   Diagnosis Date Noted   Low back pain 01/22/2021   Tennis elbow 11/27/2020   Pain in left hip 11/27/2020   Uveitis of right eye 09/15/2017   Encounter for screening colonoscopy 07/30/2017   Pilar Plate hematuria    Acute blood loss anemia    Acute renal failure (ARF) ([place]) 05/09/2015   Acute interstitial nephritis 05/09/2015   Hep C w/o coma, chronic ([facility] [facility]) 05/09/2015   Sarcoidosis 05/09/2015   HLD  (hyperlipidemia) 05/09/2015   Past Medical History:  Diagnosis Date   Anemia    Chronic kidney disease    DM (diabetes mellitus) ([facility]CC)     x 1 year   Hepatitis    Hyperlipidemia    Sarcoidosis     Family History  Problem Relation Age of Onset   Asthma Mother    CAD Mother    Lung cancer Father    Lupus Other     Past Surgical History:  Procedure Laterality Date   ABDOMINAL HYSTERECTOMY     CATARACT EXTRACTION W/PHACO Right 05/06/2018   Procedure: CATARACT EXTRACTION PHACO AND INTRAOCULAR LENS PLACEMENT RIGHT EYE;  Surgeon: Baruch Goldmann, MD;  Location: [facility];  Service: Ophthalmology;  Laterality: Right;  right   COLONOSCOPY N/A 09/15/2017   Procedure: COLONOSCOPY;  Surgeon: Rogene Houston, MD;  Location: [facility];  Service: Endoscopy;  Laterality: N/A;  8:30   POLYPECTOMY  09/15/2017   Procedure: POLYPECTOMY;  Surgeon: Rogene Houston, MD;  Location: [facility];  Service: Endoscopy;;   Social History   Occupational History   Not on file  Tobacco Use   Smoking status: Former    Packs/day: 0.25    Years: 6.00    Pack years: 1.50    Types: Cigarettes    Quit date: 05/08/2014    Years since quitting: 6.7   Smokeless tobacco: Never  Vaping Use   Vaping Use: Never used  Substance and Sexual Activity   Alcohol use: No    Alcohol/week: 3.0 standard drinks    Types: 3 Glasses of wine per week    Comment: Quit 6 months ago. Drank 2 glasses wine a week.    Drug use: No    Comment: No hx of    Sexual activity: Yes    Birth control/protection: None

## 2021-01-28 ENCOUNTER — Other Ambulatory Visit: Payer: Self-pay | Admitting: Orthopaedic Surgery

## 2021-01-28 MED ORDER — DIAZEPAM 5 MG PO TABS: 5.0000 mg | ORAL_TABLET | Freq: Once | ORAL | 0 refills | Status: AC

## 2021-02-08 ENCOUNTER — Ambulatory Visit
Admission: RE | Admit: 2021-02-08 | Discharge: 2021-02-08 | Disposition: A | Discharge status: Home / Self Care | Payer: Medicare Other | Source: Ambulatory Visit | Attending: Orthopaedic Surgery | Admitting: Orthopaedic Surgery

## 2021-02-08 ENCOUNTER — Other Ambulatory Visit: Payer: Self-pay

## 2021-02-08 DIAGNOSIS — M545 Low back pain, unspecified: Secondary | ICD-10-CM

## 2021-02-08 DIAGNOSIS — M79604 Pain in right leg: Secondary | ICD-10-CM

## 2021-02-19 ENCOUNTER — Encounter: Payer: Self-pay | Admitting: Orthopaedic Surgery

## 2021-02-19 ENCOUNTER — Ambulatory Visit (INDEPENDENT_AMBULATORY_CARE_PROVIDER_SITE_OTHER): Payer: Medicare Other | Admitting: Orthopaedic Surgery

## 2021-02-19 ENCOUNTER — Other Ambulatory Visit: Payer: Self-pay

## 2021-02-19 DIAGNOSIS — M1611 Unilateral primary osteoarthritis, right hip: Secondary | ICD-10-CM

## 2021-02-19 DIAGNOSIS — M545 Low back pain, unspecified: Secondary | ICD-10-CM

## 2021-02-19 DIAGNOSIS — G8929 Other chronic pain: Secondary | ICD-10-CM | POA: Diagnosis not present

## 2021-02-19 DIAGNOSIS — Z23 Encounter for immunization: Secondary | ICD-10-CM | POA: Diagnosis not present

## 2021-02-19 NOTE — Progress Notes (Signed)
Office Visit Note   Patient: Kristen Mcknight           Date of Birth: May 11, 1951           MRN: [id number] Visit Date: 02/19/2021              Requested by: Sasser, Silvestre Moment, MD [place],  [place] [address] PCP: Manon Hilding, MD   Assessment & Plan: Visit Diagnoses:  1. Chronic bilateral low back pain without sciatica   2. Unilateral primary osteoarthritis, right hip     Plan: Kristen Mcknight had an MRI scan of her lumbar spine revealing mild lumbar degenerative changes diffusely but negative for neural impingement or stenosis.  I think that certainly accounts for her back pain.  She is presently involved in exercises specifically for her back and can using either Tylenol or NSAIDs as needed.  She relates that a lot of the burning in her feet resolved after she stopped taking lorazepam.  We will having some localized pain in her right groin with x-rays demonstrating some mild degenerative arthritis.  She did have some pain with internal and external rotation and a little bit of loss of both of those motions compared to her right hip.  I think she has 2 specific issues involving her back and for the arthritis in her hip.  We will order an intra-articular cortisone injection for her right hip at [facility] Instructions: Return if symptoms worsen or fail to improve.   Orders:  No orders of the defined types were placed in this encounter.  No orders of the defined types were placed in this encounter.     Procedures: No procedures performed   Clinical Data: No additional findings.   Subjective: Chief Complaint  Patient presents with   Lower Back - Follow-up    MRI review  Patient presents today for follow up on her lower back. She had an MRI and is here today for those results.  Still having some trouble with her back and with her right groin.  The burning and tingling in her feet resolved after she stopped taking the losartan  HPI  Review of  Systems   Objective: Vital Signs: There were no vitals taken for this visit.  Physical Exam Constitutional:      Appearance: She is well-developed.  Eyes:     Pupils: Pupils are equal, round, and reactive to light.  Pulmonary:     Effort: Pulmonary effort is normal.  Skin:    General: Skin is warm and dry.  Neurological:     Mental Status: She is alert and oriented to person, place, and time.  Psychiatric:        Behavior: Behavior normal.    Ortho Exam awake alert and oriented x3.  Comfortable sitting walks with a little bit of a limp referable to her right hip when she first gets up from a sitting position.  There was some slight decrease in internal and external rotation of her right compared to the left hip with some mild discomfort.  This would be consistent with the arthritic changes noted by film.  Straight leg raise negative.  Motor exam intact.  No significant percussible tenderness of lumbar spine  Specialty Comments:  No specialty comments available.  Imaging: No results found.   PMFS History: Patient Active Problem List   Diagnosis Date Noted   Unilateral primary osteoarthritis, right hip 02/19/2021   Low back pain 01/22/2021  Tennis elbow 11/27/2020   Pain in left hip 11/27/2020   Uveitis of right eye 09/15/2017   Encounter for screening colonoscopy 07/30/2017   Pilar Plate hematuria    Acute blood loss anemia    Acute renal failure (ARF) ([facility]) 05/09/2015   Acute interstitial nephritis 05/09/2015   Hep C w/o coma, chronic ([place]) 05/09/2015   Sarcoidosis 05/09/2015   HLD (hyperlipidemia) 05/09/2015   Past Medical History:  Diagnosis Date   Anemia    Chronic kidney disease    DM (diabetes mellitus) (HCC)     x 1 year   Hepatitis    Hyperlipidemia    Sarcoidosis     Family History  Problem Relation Age of Onset   Asthma Mother    CAD Mother    Lung cancer Father    Lupus Other     Past Surgical History:  Procedure Laterality Date   ABDOMINAL  HYSTERECTOMY     CATARACT EXTRACTION W/PHACO Right 05/06/2018   Procedure: CATARACT EXTRACTION PHACO AND INTRAOCULAR LENS PLACEMENT RIGHT EYE;  Surgeon: Baruch Goldmann, MD;  Location: [facility];  Service: Ophthalmology;  Laterality: Right;  right   COLONOSCOPY N/A 09/15/2017   Procedure: COLONOSCOPY;  Surgeon: Rogene Houston, MD;  [facility];  Service: Endoscopy;  Laterality: N/A;  8:30   POLYPECTOMY  09/15/2017   Procedure: POLYPECTOMY;  Surgeon: Rogene Houston, MD;  [facility];  Service: Endoscopy;;   Social History   Occupational History   Not on file  Tobacco Use   Smoking status: Former    Packs/day: 0.25    Years: 6.00    Pack years: 1.50    Types: Cigarettes    Quit date: 05/08/2014    Years since quitting: 6.7   Smokeless tobacco: Never  Vaping Use   Vaping Use: Never used  Substance and Sexual Activity   Alcohol use: No    Alcohol/week: 3.0 standard drinks    Types: 3 Glasses of wine per week    Comment: Quit 6 months ago. Drank 2 glasses wine a week.    Drug use: No    Comment: No hx of    Sexual activity: Yes    Birth control/protection: None

## 2021-02-20 NOTE — Addendum Note (Signed)
Addended by: Lendon Collar on: 02/20/2021 11:21 AM   Modules accepted: Orders

## 2021-02-27 ENCOUNTER — Other Ambulatory Visit: Payer: Self-pay

## 2021-02-27 ENCOUNTER — Ambulatory Visit (HOSPITAL_COMMUNITY)
Admission: RE | Admit: 2021-02-27 | Discharge: 2021-02-27 | Disposition: A | Discharge status: Home / Self Care | Payer: Medicare Other | Source: Ambulatory Visit | Attending: Orthopaedic Surgery | Admitting: Orthopaedic Surgery

## 2021-02-27 ENCOUNTER — Telehealth: Payer: Self-pay | Admitting: *Deleted

## 2021-02-27 DIAGNOSIS — M1611 Unilateral primary osteoarthritis, right hip: Secondary | ICD-10-CM | POA: Diagnosis not present

## 2021-02-27 MED ORDER — LIDOCAINE HCL (PF) 1 % IJ SOLN
INTRAMUSCULAR | Status: AC
  Administered 2021-02-27: 3 mL
  Filled 2021-02-27: qty 5

## 2021-02-27 MED ORDER — TRIAMCINOLONE ACETONIDE 40 MG/ML IJ SUSP
INTRAMUSCULAR | Status: AC
  Administered 2021-02-27: 40 mg
  Filled 2021-02-27: qty 1

## 2021-02-27 MED ORDER — POVIDONE-IODINE 10 % EX SOLN
CUTANEOUS | Status: AC
  Administered 2021-02-27: 1
  Filled 2021-02-27: qty 15

## 2021-02-27 MED ORDER — BUPIVACAINE HCL (PF) 0.5 % IJ SOLN
INTRAMUSCULAR | Status: AC
  Administered 2021-02-27: 3 mg
  Filled 2021-02-27: qty 30

## 2021-02-27 NOTE — Procedures (Signed)
Preprocedure Dx: Primary osteoarthritis RIGHT hip Postprocedure Dx: Primary osteoarthritis RIGHT hip Procedure  Fluoroscopically guided therapeutic RIGHT hip joint injection Radiologist:  Thornton Papas Anesthesia:  3 ml of 1% lidocaine Injectate:  40 mg kenalog, 3 ml sensorcaine 0.5% Fluoro time:  0 minutes 42 seconds EBL:   None Complications: None

## 2021-02-27 NOTE — Progress Notes (Signed)
Patient on radiology table for therapeutic injection, consent obtained/completed by provider prior to RN arrival. Vitals obtained and stable pre and post injection. No complaints of pain.

## 2021-02-27 NOTE — Telephone Encounter (Signed)
Appt today for injection at [facility] at 2:30

## 2021-02-27 NOTE — Telephone Encounter (Signed)
-----   Message from Lendon Collar, RT sent at 02/20/2021 11:21 AM EDT -----  [id number] Right hip injection to be done at [facility].  Thanks!!

## 2021-03-26 DIAGNOSIS — R809 Proteinuria, unspecified: Secondary | ICD-10-CM | POA: Diagnosis not present

## 2021-03-26 DIAGNOSIS — E11620 Type 2 diabetes mellitus with diabetic dermatitis: Secondary | ICD-10-CM | POA: Diagnosis not present

## 2021-03-26 DIAGNOSIS — N189 Chronic kidney disease, unspecified: Secondary | ICD-10-CM | POA: Diagnosis not present

## 2021-03-26 DIAGNOSIS — E782 Mixed hyperlipidemia: Secondary | ICD-10-CM | POA: Diagnosis not present

## 2021-03-26 DIAGNOSIS — E875 Hyperkalemia: Secondary | ICD-10-CM | POA: Diagnosis not present

## 2021-03-26 DIAGNOSIS — I129 Hypertensive chronic kidney disease with stage 1 through stage 4 chronic kidney disease, or unspecified chronic kidney disease: Secondary | ICD-10-CM | POA: Diagnosis not present

## 2021-03-26 DIAGNOSIS — R799 Abnormal finding of blood chemistry, unspecified: Secondary | ICD-10-CM | POA: Diagnosis not present

## 2021-03-26 DIAGNOSIS — I10 Essential (primary) hypertension: Secondary | ICD-10-CM | POA: Diagnosis not present

## 2021-04-02 DIAGNOSIS — R809 Proteinuria, unspecified: Secondary | ICD-10-CM | POA: Diagnosis not present

## 2021-04-02 DIAGNOSIS — I129 Hypertensive chronic kidney disease with stage 1 through stage 4 chronic kidney disease, or unspecified chronic kidney disease: Secondary | ICD-10-CM | POA: Diagnosis not present

## 2021-04-02 DIAGNOSIS — E1129 Type 2 diabetes mellitus with other diabetic kidney complication: Secondary | ICD-10-CM | POA: Diagnosis not present

## 2021-04-02 DIAGNOSIS — E1122 Type 2 diabetes mellitus with diabetic chronic kidney disease: Secondary | ICD-10-CM | POA: Diagnosis not present

## 2021-04-02 DIAGNOSIS — N189 Chronic kidney disease, unspecified: Secondary | ICD-10-CM | POA: Diagnosis not present

## 2021-04-03 DIAGNOSIS — I25118 Atherosclerotic heart disease of native coronary artery with other forms of angina pectoris: Secondary | ICD-10-CM | POA: Diagnosis not present

## 2021-04-03 DIAGNOSIS — I10 Essential (primary) hypertension: Secondary | ICD-10-CM | POA: Diagnosis not present

## 2021-04-03 DIAGNOSIS — E782 Mixed hyperlipidemia: Secondary | ICD-10-CM | POA: Diagnosis not present

## 2021-04-09 DIAGNOSIS — D860 Sarcoidosis of lung: Secondary | ICD-10-CM | POA: Diagnosis not present

## 2021-04-09 DIAGNOSIS — M791 Myalgia, unspecified site: Secondary | ICD-10-CM | POA: Diagnosis not present

## 2021-04-09 DIAGNOSIS — E7849 Other hyperlipidemia: Secondary | ICD-10-CM | POA: Diagnosis not present

## 2021-04-09 DIAGNOSIS — E1165 Type 2 diabetes mellitus with hyperglycemia: Secondary | ICD-10-CM | POA: Diagnosis not present

## 2021-04-09 DIAGNOSIS — K2100 Gastro-esophageal reflux disease with esophagitis, without bleeding: Secondary | ICD-10-CM | POA: Diagnosis not present

## 2021-04-09 DIAGNOSIS — R768 Other specified abnormal immunological findings in serum: Secondary | ICD-10-CM | POA: Diagnosis not present

## 2021-04-09 DIAGNOSIS — I10 Essential (primary) hypertension: Secondary | ICD-10-CM | POA: Diagnosis not present

## 2021-04-09 DIAGNOSIS — N179 Acute kidney failure, unspecified: Secondary | ICD-10-CM | POA: Diagnosis not present

## 2021-04-09 DIAGNOSIS — H44131 Sympathetic uveitis, right eye: Secondary | ICD-10-CM | POA: Diagnosis not present

## 2021-04-22 DIAGNOSIS — Z961 Presence of intraocular lens: Secondary | ICD-10-CM | POA: Diagnosis not present

## 2021-04-22 DIAGNOSIS — H21541 Posterior synechiae (iris), right eye: Secondary | ICD-10-CM | POA: Diagnosis not present

## 2021-04-22 DIAGNOSIS — H30031 Focal chorioretinal inflammation, peripheral, right eye: Secondary | ICD-10-CM | POA: Diagnosis not present

## 2021-04-22 DIAGNOSIS — Z79899 Other long term (current) drug therapy: Secondary | ICD-10-CM | POA: Diagnosis not present

## 2021-05-13 DIAGNOSIS — I129 Hypertensive chronic kidney disease with stage 1 through stage 4 chronic kidney disease, or unspecified chronic kidney disease: Secondary | ICD-10-CM | POA: Diagnosis not present

## 2021-05-13 DIAGNOSIS — E1122 Type 2 diabetes mellitus with diabetic chronic kidney disease: Secondary | ICD-10-CM | POA: Diagnosis not present

## 2021-05-13 DIAGNOSIS — N181 Chronic kidney disease, stage 1: Secondary | ICD-10-CM | POA: Diagnosis not present

## 2021-05-13 DIAGNOSIS — R809 Proteinuria, unspecified: Secondary | ICD-10-CM | POA: Diagnosis not present

## 2021-05-13 DIAGNOSIS — N186 End stage renal disease: Secondary | ICD-10-CM | POA: Diagnosis not present

## 2021-05-13 DIAGNOSIS — N189 Chronic kidney disease, unspecified: Secondary | ICD-10-CM | POA: Diagnosis not present

## 2021-06-24 DIAGNOSIS — R350 Frequency of micturition: Secondary | ICD-10-CM | POA: Diagnosis not present

## 2021-07-23 DIAGNOSIS — E7800 Pure hypercholesterolemia, unspecified: Secondary | ICD-10-CM | POA: Diagnosis not present

## 2021-07-23 DIAGNOSIS — E7849 Other hyperlipidemia: Secondary | ICD-10-CM | POA: Diagnosis not present

## 2021-07-23 DIAGNOSIS — E782 Mixed hyperlipidemia: Secondary | ICD-10-CM | POA: Diagnosis not present

## 2021-07-23 DIAGNOSIS — E1165 Type 2 diabetes mellitus with hyperglycemia: Secondary | ICD-10-CM | POA: Diagnosis not present

## 2021-07-23 DIAGNOSIS — N179 Acute kidney failure, unspecified: Secondary | ICD-10-CM | POA: Diagnosis not present

## 2021-07-23 DIAGNOSIS — E1122 Type 2 diabetes mellitus with diabetic chronic kidney disease: Secondary | ICD-10-CM | POA: Diagnosis not present

## 2021-08-04 DIAGNOSIS — D860 Sarcoidosis of lung: Secondary | ICD-10-CM | POA: Diagnosis not present

## 2021-08-04 DIAGNOSIS — I10 Essential (primary) hypertension: Secondary | ICD-10-CM | POA: Diagnosis not present

## 2021-08-04 DIAGNOSIS — R768 Other specified abnormal immunological findings in serum: Secondary | ICD-10-CM | POA: Diagnosis not present

## 2021-08-04 DIAGNOSIS — E1165 Type 2 diabetes mellitus with hyperglycemia: Secondary | ICD-10-CM | POA: Diagnosis not present

## 2021-08-04 DIAGNOSIS — H44131 Sympathetic uveitis, right eye: Secondary | ICD-10-CM | POA: Diagnosis not present

## 2021-08-04 DIAGNOSIS — E7849 Other hyperlipidemia: Secondary | ICD-10-CM | POA: Diagnosis not present

## 2021-08-04 DIAGNOSIS — R202 Paresthesia of skin: Secondary | ICD-10-CM | POA: Diagnosis not present

## 2021-08-04 DIAGNOSIS — M791 Myalgia, unspecified site: Secondary | ICD-10-CM | POA: Diagnosis not present

## 2021-08-26 DIAGNOSIS — Z1231 Encounter for screening mammogram for malignant neoplasm of breast: Secondary | ICD-10-CM | POA: Diagnosis not present

## 2021-09-23 DIAGNOSIS — N182 Chronic kidney disease, stage 2 (mild): Secondary | ICD-10-CM | POA: Diagnosis not present

## 2021-09-23 DIAGNOSIS — E559 Vitamin D deficiency, unspecified: Secondary | ICD-10-CM | POA: Diagnosis not present

## 2021-10-06 DIAGNOSIS — I10 Essential (primary) hypertension: Secondary | ICD-10-CM | POA: Diagnosis not present

## 2021-10-06 DIAGNOSIS — Z789 Other specified health status: Secondary | ICD-10-CM | POA: Diagnosis not present

## 2021-10-06 DIAGNOSIS — E782 Mixed hyperlipidemia: Secondary | ICD-10-CM | POA: Diagnosis not present

## 2021-10-06 DIAGNOSIS — I2510 Atherosclerotic heart disease of native coronary artery without angina pectoris: Secondary | ICD-10-CM | POA: Diagnosis not present

## 2021-10-08 DIAGNOSIS — R809 Proteinuria, unspecified: Secondary | ICD-10-CM | POA: Diagnosis not present

## 2021-10-08 DIAGNOSIS — I129 Hypertensive chronic kidney disease with stage 1 through stage 4 chronic kidney disease, or unspecified chronic kidney disease: Secondary | ICD-10-CM | POA: Diagnosis not present

## 2021-10-08 DIAGNOSIS — E1129 Type 2 diabetes mellitus with other diabetic kidney complication: Secondary | ICD-10-CM | POA: Diagnosis not present

## 2021-10-08 DIAGNOSIS — E1122 Type 2 diabetes mellitus with diabetic chronic kidney disease: Secondary | ICD-10-CM | POA: Diagnosis not present

## 2021-10-08 DIAGNOSIS — N189 Chronic kidney disease, unspecified: Secondary | ICD-10-CM | POA: Diagnosis not present

## 2021-10-08 DIAGNOSIS — R718 Other abnormality of red blood cells: Secondary | ICD-10-CM | POA: Diagnosis not present

## 2021-10-09 DIAGNOSIS — E1122 Type 2 diabetes mellitus with diabetic chronic kidney disease: Secondary | ICD-10-CM | POA: Diagnosis not present

## 2021-10-09 DIAGNOSIS — N186 End stage renal disease: Secondary | ICD-10-CM | POA: Diagnosis not present

## 2021-10-22 DIAGNOSIS — M542 Cervicalgia: Secondary | ICD-10-CM | POA: Diagnosis not present

## 2021-10-24 DIAGNOSIS — Z808 Family history of malignant neoplasm of other organs or systems: Secondary | ICD-10-CM | POA: Diagnosis not present

## 2021-10-24 DIAGNOSIS — M542 Cervicalgia: Secondary | ICD-10-CM | POA: Diagnosis not present

## 2021-11-06 DIAGNOSIS — R0602 Shortness of breath: Secondary | ICD-10-CM | POA: Diagnosis not present

## 2021-11-11 ENCOUNTER — Other Ambulatory Visit (HOSPITAL_BASED_OUTPATIENT_CLINIC_OR_DEPARTMENT_OTHER): Payer: Self-pay

## 2021-11-11 DIAGNOSIS — G478 Other sleep disorders: Secondary | ICD-10-CM

## 2021-11-11 DIAGNOSIS — R0683 Snoring: Secondary | ICD-10-CM

## 2021-11-14 DIAGNOSIS — H21541 Posterior synechiae (iris), right eye: Secondary | ICD-10-CM | POA: Diagnosis not present

## 2021-11-14 DIAGNOSIS — H30031 Focal chorioretinal inflammation, peripheral, right eye: Secondary | ICD-10-CM | POA: Diagnosis not present

## 2021-11-14 DIAGNOSIS — Z79899 Other long term (current) drug therapy: Secondary | ICD-10-CM | POA: Diagnosis not present

## 2021-11-14 DIAGNOSIS — Z961 Presence of intraocular lens: Secondary | ICD-10-CM | POA: Diagnosis not present

## 2021-11-28 DIAGNOSIS — E7800 Pure hypercholesterolemia, unspecified: Secondary | ICD-10-CM | POA: Diagnosis not present

## 2021-11-28 DIAGNOSIS — K2100 Gastro-esophageal reflux disease with esophagitis, without bleeding: Secondary | ICD-10-CM | POA: Diagnosis not present

## 2021-11-28 DIAGNOSIS — E1165 Type 2 diabetes mellitus with hyperglycemia: Secondary | ICD-10-CM | POA: Diagnosis not present

## 2021-11-28 DIAGNOSIS — E782 Mixed hyperlipidemia: Secondary | ICD-10-CM | POA: Diagnosis not present

## 2021-11-28 DIAGNOSIS — I10 Essential (primary) hypertension: Secondary | ICD-10-CM | POA: Diagnosis not present

## 2021-11-28 DIAGNOSIS — E7849 Other hyperlipidemia: Secondary | ICD-10-CM | POA: Diagnosis not present

## 2021-12-02 DIAGNOSIS — D860 Sarcoidosis of lung: Secondary | ICD-10-CM | POA: Diagnosis not present

## 2021-12-02 DIAGNOSIS — K2100 Gastro-esophageal reflux disease with esophagitis, without bleeding: Secondary | ICD-10-CM | POA: Diagnosis not present

## 2021-12-02 DIAGNOSIS — I10 Essential (primary) hypertension: Secondary | ICD-10-CM | POA: Diagnosis not present

## 2021-12-02 DIAGNOSIS — E7849 Other hyperlipidemia: Secondary | ICD-10-CM | POA: Diagnosis not present

## 2021-12-02 DIAGNOSIS — Z1389 Encounter for screening for other disorder: Secondary | ICD-10-CM | POA: Diagnosis not present

## 2021-12-02 DIAGNOSIS — M791 Myalgia, unspecified site: Secondary | ICD-10-CM | POA: Diagnosis not present

## 2021-12-02 DIAGNOSIS — N179 Acute kidney failure, unspecified: Secondary | ICD-10-CM | POA: Diagnosis not present

## 2021-12-02 DIAGNOSIS — R768 Other specified abnormal immunological findings in serum: Secondary | ICD-10-CM | POA: Diagnosis not present

## 2021-12-02 DIAGNOSIS — R202 Paresthesia of skin: Secondary | ICD-10-CM | POA: Diagnosis not present

## 2021-12-02 DIAGNOSIS — E1165 Type 2 diabetes mellitus with hyperglycemia: Secondary | ICD-10-CM | POA: Diagnosis not present

## 2021-12-02 DIAGNOSIS — H44131 Sympathetic uveitis, right eye: Secondary | ICD-10-CM | POA: Diagnosis not present

## 2021-12-09 ENCOUNTER — Encounter: Payer: Medicare Other | Admitting: Neurology

## 2022-01-06 DIAGNOSIS — N182 Chronic kidney disease, stage 2 (mild): Secondary | ICD-10-CM | POA: Diagnosis not present

## 2022-01-12 ENCOUNTER — Ambulatory Visit: Payer: Medicare Other | Attending: Nephrology | Admitting: Neurology

## 2022-01-12 DIAGNOSIS — G4733 Obstructive sleep apnea (adult) (pediatric): Secondary | ICD-10-CM | POA: Insufficient documentation

## 2022-01-12 DIAGNOSIS — G478 Other sleep disorders: Secondary | ICD-10-CM | POA: Diagnosis not present

## 2022-01-12 DIAGNOSIS — R0683 Snoring: Secondary | ICD-10-CM

## 2022-01-14 DIAGNOSIS — I129 Hypertensive chronic kidney disease with stage 1 through stage 4 chronic kidney disease, or unspecified chronic kidney disease: Secondary | ICD-10-CM | POA: Diagnosis not present

## 2022-01-14 DIAGNOSIS — N189 Chronic kidney disease, unspecified: Secondary | ICD-10-CM | POA: Diagnosis not present

## 2022-01-14 DIAGNOSIS — R809 Proteinuria, unspecified: Secondary | ICD-10-CM | POA: Diagnosis not present

## 2022-01-14 DIAGNOSIS — E1122 Type 2 diabetes mellitus with diabetic chronic kidney disease: Secondary | ICD-10-CM | POA: Diagnosis not present

## 2022-01-14 DIAGNOSIS — E1129 Type 2 diabetes mellitus with other diabetic kidney complication: Secondary | ICD-10-CM | POA: Diagnosis not present

## 2022-01-14 DIAGNOSIS — I5032 Chronic diastolic (congestive) heart failure: Secondary | ICD-10-CM | POA: Diagnosis not present

## 2022-01-17 NOTE — Procedures (Signed)
  [facility] A. Merlene Laughter, MD     [email or web address]             NOCTURNAL POLYSOMNOGRAPHY   LOCATION: [facility]  Patient Name: Kristen Mcknight, Kristen Mcknight Date: 01/12/2022 Gender: Female D.O.B: [birth year] Age (years): 23 Referring Provider: Manpreet Bhutani Height (inches): 66 Interpreting Physician: Phillips Odor MD, ABSM Weight (lbs): 178 RPSGT: Rosebud Poles BMI: 29 MRN: [id number] Neck Size: 15.50 CLINICAL INFORMATION Sleep Study Type: NPSG     Indication for sleep study: Snoring     Epworth Sleepiness Score: 5     SLEEP STUDY TECHNIQUE As per the AASM Manual for the Scoring of Sleep and Associated Events v2.3 (April 2016) with a hypopnea requiring 4% desaturations.  The channels recorded and monitored were frontal, central and occipital EEG, electrooculogram (EOG), submentalis EMG (chin), nasal and oral airflow, thoracic and abdominal wall motion, anterior tibialis EMG, snore microphone, electrocardiogram, and pulse oximetry.  MEDICATIONS Medications self-administered by patient taken the night of the study : N/A     SLEEP ARCHITECTURE The study was initiated at 10:32:56 PM and ended at 5:12:25 AM.  Sleep onset time was 11.2 minutes and the sleep efficiency was 62.1%. The total sleep time was 248 minutes.  Stage REM latency was 257.0 minutes.  The patient spent 11.69% of the night in stage N1 sleep, 55.24% in stage N2 sleep, 11.29% in stage N3 and 21.8% in REM.  Alpha intrusion was absent.  Supine sleep was 0.00%.  RESPIRATORY PARAMETERS The overall apnea/hypopnea index (AHI) was 12.1 per hour. There were 17 total apneas, including 17 obstructive, 0 central and 0 mixed apneas. There were 33 hypopneas and 0 RERAs.  The AHI during Stage REM sleep was 53.3 per hour.  AHI while supine was N/A per hour.  The mean oxygen saturation was 92.55%. The minimum SpO2 during sleep was 86.00%.  moderate snoring was noted during this  study.  CARDIAC DATA The 2 lead EKG demonstrated sinus rhythm. The mean heart rate was 59.09 beats per minute. Other EKG findings include: None. LEG MOVEMENT DATA The total PLMS were 262 with a resulting PLMS index of 63.39. Associated arousal with leg movement index was 10.2.  IMPRESSIONS Mild to moderate obstructive sleep apnea. Autopap 8-14 is recommended.  2.   Severe periodic limb movements of sleep occurred during the study. Associated arousals were significant.    Delano Metz, MD Diplomate, American Board of Sleep Medicine.    ELECTRONICALLY SIGNED ON:  01/17/2022, 8:52 AM [facility] PH: [phone]   FX: [phone] [place]

## 2022-03-16 ENCOUNTER — Ambulatory Visit (INDEPENDENT_AMBULATORY_CARE_PROVIDER_SITE_OTHER): Payer: Medicare Other | Admitting: Pulmonary Disease

## 2022-03-16 ENCOUNTER — Encounter: Payer: Self-pay | Admitting: Pulmonary Disease

## 2022-03-16 DIAGNOSIS — G4733 Obstructive sleep apnea (adult) (pediatric): Secondary | ICD-10-CM

## 2022-03-16 DIAGNOSIS — D869 Sarcoidosis, unspecified: Secondary | ICD-10-CM | POA: Diagnosis not present

## 2022-03-16 NOTE — Assessment & Plan Note (Signed)
Appears to be in remission. CT coronaries from 2022 does not show any evidence of lymphadenopathy or parenchymal infiltrates

## 2022-03-16 NOTE — Patient Instructions (Addendum)
  You have mild sleep apnea  Weight loss of 10 Lbs recommended  Ask your dentist if they make a mouthguard for sleep apnea Otherwise we can trial CPAP

## 2022-03-16 NOTE — Assessment & Plan Note (Signed)
Reviewed sleep study in detail with patient, she has mild OSA.  However her symptom burden is low and cardiovascular profile is reasonable.  Blood pressures controlled with 2 medications.  The only symptom of concern is witnessed apneas, no bed partner history available with husband today. I reviewed options with patient including treatment with dental appliance or CPAP or the option of no treatment.  She would like to think about this some more and get back to [place].  Regardless we will reassess in 6 months  The pathophysiology of obstructive sleep apnea , it's cardiovascular consequences & modes of treatment including CPAP were discused with the patient in detail & they evidenced understanding.

## 2022-03-16 NOTE — Progress Notes (Signed)
Subjective:    Patient ID: Kristen Mcknight, female    DOB: 01-Jun-1950, 71 y.o.   MRN: [id number]  HPI  71 year old retired [employer] presents for evaluation of obstructive sleep apnea.   PMH -hypertension on 2 medications CKD stage III Diabetes type 2 Sarcoidosis-diagnosed 25 years ago, in remission  Her husband has witnessed apneas and loud snoring.  She also reports dry mouth in the mornings and minimal sleepiness in the daytime. Epworth sleepiness score is 3. Bedtime is around 11:30 PM, sleep latency is minimal, she reported sleeping on her side with 1 pillow with 1-2 nocturnal awakenings and is out of bed at 8 AM feeling rested with dryness of mouth but denies headaches. His weight has fluctuated within 5 pounds of her current weight. There is no history suggestive of cataplexy, sleep paralysis or parasomnias  Significant tests/ events reviewed  CT coronaries 05/2020 clear lungs, no lymphadenopathy  12/2021 NPSG -TST 248 minutes, AHI 12/hour, lowest desaturation 86%, PLM 63/hour, PLM arousals 10/hour  Past Medical History:  Diagnosis Date   Anemia    Chronic kidney disease    DM (diabetes mellitus) ([place])     x 1 year   Hepatitis    Hyperlipidemia    Sarcoidosis     Past Surgical History:  Procedure Laterality Date   ABDOMINAL HYSTERECTOMY     CATARACT EXTRACTION W/PHACO Right 05/06/2018   Procedure: CATARACT EXTRACTION PHACO AND INTRAOCULAR LENS PLACEMENT RIGHT EYE;  Surgeon: Baruch Goldmann, MD;  Location: [facility];  Service: Ophthalmology;  Laterality: Right;  right   COLONOSCOPY N/A 09/15/2017   Procedure: COLONOSCOPY;  Surgeon: Rogene Houston, MD;  [facility];  Service: Endoscopy;  Laterality: N/A;  8:30   POLYPECTOMY  09/15/2017   Procedure: POLYPECTOMY;  Surgeon: Rogene Houston, MD;  [facility];  Service: Endoscopy;;    Allergies  Allergen Reactions   Codeine Hives    Social History   Socioeconomic History   Marital  status: Married    Spouse name: Not on file   Number of children: Not on file   Years of education: Not on file   Highest education level: Not on file  Occupational History   Not on file  Tobacco Use   Smoking status: Former    Packs/day: 0.25    Years: 6.00    Total pack years: 1.50    Types: Cigarettes    Quit date: 05/08/2014    Years since quitting: 7.8   Smokeless tobacco: Never  Vaping Use   Vaping Use: Never used  Substance and Sexual Activity   Alcohol use: No    Alcohol/week: 3.0 standard drinks of alcohol    Types: 3 Glasses of wine per week    Comment: Quit 6 months ago. Drank 2 glasses wine a week.    Drug use: No    Comment: No hx of    Sexual activity: Yes    Birth control/protection: None  Other Topics Concern   Not on file  Social History Narrative   Not on file   Social Determinants of Health   Financial Resource Strain: Not on file  Food Insecurity: Not on file  Transportation Needs: Not on file  Physical Activity: Not on file  Stress: Not on file  Social Connections: Not on file  Intimate Partner Violence: Not on file    Family History  Problem Relation Age of Onset   Asthma Mother    CAD Mother  Lung cancer Father    Lupus Other      Review of Systems + for Shortness of breath with activity Acid heartburn Sore throat Sneezing Joint stiffness    Objective:   Physical Exam  Gen. Pleasant, obese, in no distress, normal affect ENT - no pallor,icterus, no post nasal drip, class 2 airway Neck: No JVD, no thyromegaly, no carotid bruits Lungs: no use of accessory muscles, no dullness to percussion, decreased without rales or rhonchi  Cardiovascular: Rhythm regular, heart sounds  normal, no murmurs or gallops, no peripheral edema Abdomen: soft and non-tender, no hepatosplenomegaly, BS normal. Musculoskeletal: No deformities, no cyanosis or clubbing Neuro:  alert, non focal, no tremors       Assessment & Plan:    PLM's with  arousals -arousals are significant but she does not give a clinical history of restless leg syndrome. We will hold off on treatment

## 2022-03-25 DIAGNOSIS — I10 Essential (primary) hypertension: Secondary | ICD-10-CM | POA: Diagnosis not present

## 2022-03-25 DIAGNOSIS — E782 Mixed hyperlipidemia: Secondary | ICD-10-CM | POA: Diagnosis not present

## 2022-03-25 DIAGNOSIS — E1122 Type 2 diabetes mellitus with diabetic chronic kidney disease: Secondary | ICD-10-CM | POA: Diagnosis not present

## 2022-03-25 DIAGNOSIS — E7849 Other hyperlipidemia: Secondary | ICD-10-CM | POA: Diagnosis not present

## 2022-03-25 DIAGNOSIS — K2100 Gastro-esophageal reflux disease with esophagitis, without bleeding: Secondary | ICD-10-CM | POA: Diagnosis not present

## 2022-03-25 DIAGNOSIS — E1165 Type 2 diabetes mellitus with hyperglycemia: Secondary | ICD-10-CM | POA: Diagnosis not present

## 2022-03-30 DIAGNOSIS — I10 Essential (primary) hypertension: Secondary | ICD-10-CM | POA: Diagnosis not present

## 2022-03-30 DIAGNOSIS — Z0001 Encounter for general adult medical examination with abnormal findings: Secondary | ICD-10-CM | POA: Diagnosis not present

## 2022-03-30 DIAGNOSIS — E7849 Other hyperlipidemia: Secondary | ICD-10-CM | POA: Diagnosis not present

## 2022-03-30 DIAGNOSIS — R202 Paresthesia of skin: Secondary | ICD-10-CM | POA: Diagnosis not present

## 2022-03-30 DIAGNOSIS — D860 Sarcoidosis of lung: Secondary | ICD-10-CM | POA: Diagnosis not present

## 2022-03-30 DIAGNOSIS — E1165 Type 2 diabetes mellitus with hyperglycemia: Secondary | ICD-10-CM | POA: Diagnosis not present

## 2022-03-30 DIAGNOSIS — M791 Myalgia, unspecified site: Secondary | ICD-10-CM | POA: Diagnosis not present

## 2022-03-30 DIAGNOSIS — H44131 Sympathetic uveitis, right eye: Secondary | ICD-10-CM | POA: Diagnosis not present

## 2022-03-30 DIAGNOSIS — R768 Other specified abnormal immunological findings in serum: Secondary | ICD-10-CM | POA: Diagnosis not present

## 2022-03-30 DIAGNOSIS — N179 Acute kidney failure, unspecified: Secondary | ICD-10-CM | POA: Diagnosis not present

## 2022-04-13 ENCOUNTER — Telehealth: Payer: Self-pay | Admitting: Pulmonary Disease

## 2022-04-13 DIAGNOSIS — D869 Sarcoidosis, unspecified: Secondary | ICD-10-CM

## 2022-04-13 DIAGNOSIS — G4733 Obstructive sleep apnea (adult) (pediatric): Secondary | ICD-10-CM

## 2022-04-13 NOTE — Telephone Encounter (Signed)
Pt also wanted to see about lung cancer screening program as well.  Pt has hx of sarcoidosis.  Please advise.

## 2022-04-13 NOTE — Telephone Encounter (Signed)
Pt decided she wanted to get a CPAP machine after discussing with Dr. Elsworth Soho at appt.  Sun.  Please advise.  Incoming call    Dr. Elsworth Soho please advise, are you okay with [place] ordering CPAP for patient and if so what settings?  Thanks!

## 2022-04-15 NOTE — Telephone Encounter (Signed)
Called and notified patient of response. She voiced understanding. Nothing further needed.

## 2022-04-17 ENCOUNTER — Telehealth: Payer: Self-pay | Admitting: Pulmonary Disease

## 2022-04-17 NOTE — Telephone Encounter (Signed)
I think Kristen Mcknight has faxed to [facility] today. Kristen Mcknight can you confirm? Thanks!

## 2022-04-17 NOTE — Telephone Encounter (Signed)
Faxed to [facility] 

## 2022-04-17 NOTE — Telephone Encounter (Signed)
Order was originally faxed to [facility] and pt called this morning wanting faxed to [facility].  See previous encounter.  Please advise.

## 2022-04-20 NOTE — Telephone Encounter (Signed)
Order was faxed to [facility] on Friday.

## 2022-04-22 ENCOUNTER — Other Ambulatory Visit: Payer: Self-pay | Admitting: *Deleted

## 2022-04-22 DIAGNOSIS — Z87891 Personal history of nicotine dependence: Secondary | ICD-10-CM

## 2022-04-22 DIAGNOSIS — Z122 Encounter for screening for malignant neoplasm of respiratory organs: Secondary | ICD-10-CM

## 2022-04-23 DIAGNOSIS — I5032 Chronic diastolic (congestive) heart failure: Secondary | ICD-10-CM | POA: Diagnosis not present

## 2022-04-23 DIAGNOSIS — N189 Chronic kidney disease, unspecified: Secondary | ICD-10-CM | POA: Diagnosis not present

## 2022-04-29 DIAGNOSIS — E1122 Type 2 diabetes mellitus with diabetic chronic kidney disease: Secondary | ICD-10-CM | POA: Diagnosis not present

## 2022-04-29 DIAGNOSIS — I129 Hypertensive chronic kidney disease with stage 1 through stage 4 chronic kidney disease, or unspecified chronic kidney disease: Secondary | ICD-10-CM | POA: Diagnosis not present

## 2022-04-29 DIAGNOSIS — E1129 Type 2 diabetes mellitus with other diabetic kidney complication: Secondary | ICD-10-CM | POA: Diagnosis not present

## 2022-04-29 DIAGNOSIS — R809 Proteinuria, unspecified: Secondary | ICD-10-CM | POA: Diagnosis not present

## 2022-04-29 DIAGNOSIS — I5032 Chronic diastolic (congestive) heart failure: Secondary | ICD-10-CM | POA: Diagnosis not present

## 2022-04-29 DIAGNOSIS — N189 Chronic kidney disease, unspecified: Secondary | ICD-10-CM | POA: Diagnosis not present

## 2022-04-29 DIAGNOSIS — G4733 Obstructive sleep apnea (adult) (pediatric): Secondary | ICD-10-CM | POA: Diagnosis not present

## 2022-05-21 DIAGNOSIS — M810 Age-related osteoporosis without current pathological fracture: Secondary | ICD-10-CM | POA: Diagnosis not present

## 2022-05-21 DIAGNOSIS — Z780 Asymptomatic menopausal state: Secondary | ICD-10-CM | POA: Diagnosis not present

## 2022-05-21 DIAGNOSIS — Z13820 Encounter for screening for osteoporosis: Secondary | ICD-10-CM | POA: Diagnosis not present

## 2022-05-25 DIAGNOSIS — I10 Essential (primary) hypertension: Secondary | ICD-10-CM | POA: Diagnosis not present

## 2022-05-25 DIAGNOSIS — I25118 Atherosclerotic heart disease of native coronary artery with other forms of angina pectoris: Secondary | ICD-10-CM | POA: Diagnosis not present

## 2022-06-09 DIAGNOSIS — Z79899 Other long term (current) drug therapy: Secondary | ICD-10-CM | POA: Diagnosis not present

## 2022-06-09 DIAGNOSIS — H30031 Focal chorioretinal inflammation, peripheral, right eye: Secondary | ICD-10-CM | POA: Diagnosis not present

## 2022-06-09 DIAGNOSIS — H21541 Posterior synechiae (iris), right eye: Secondary | ICD-10-CM | POA: Diagnosis not present

## 2022-06-09 DIAGNOSIS — Z961 Presence of intraocular lens: Secondary | ICD-10-CM | POA: Diagnosis not present

## 2022-06-10 ENCOUNTER — Encounter: Payer: Self-pay | Admitting: Acute Care

## 2022-06-10 ENCOUNTER — Ambulatory Visit (INDEPENDENT_AMBULATORY_CARE_PROVIDER_SITE_OTHER): Payer: Medicare Other | Admitting: Acute Care

## 2022-06-10 DIAGNOSIS — Z87891 Personal history of nicotine dependence: Secondary | ICD-10-CM | POA: Diagnosis not present

## 2022-06-10 NOTE — Progress Notes (Signed)
Virtual Visit via Telephone Note  I connected with Renda Rolls on 06/10/22 at 10:30 AM EST by telephone and verified that I am speaking with the correct person using two identifiers.  Location: Patient:  At home Provider: [address], [place], [place], [address]    I discussed the limitations, risks, security and privacy concerns of performing an evaluation and management service by telephone and the availability of in person appointments. I also discussed with the patient that there may be a patient responsible charge related to this service. The patient expressed understanding and agreed to proceed.    Shared Decision Making Visit Lung Cancer Screening Program [phone])   Eligibility: Age 72 y.o. Pack Years Smoking History Calculation 30 pack year smoking history (# packs/per year x # years smoked) Recent History of coughing up blood  no Unexplained weight loss? no ( >Than 15 pounds within the last 6 months ) Prior History Lung / other cancer no (Diagnosis within the last 5 years already requiring surveillance chest CT Scans). Smoking Status Former Smoker Former Smokers: Years since quit: 8 years  Quit Date: 05/08/2014  Visit Components: Discussion included one or more decision making aids. yes Discussion included risk/benefits of screening. yes Discussion included potential follow up diagnostic testing for abnormal scans. yes Discussion included meaning and risk of over diagnosis. yes Discussion included meaning and risk of False Positives. yes Discussion included meaning of total radiation exposure. yes  Counseling Included: Importance of adherence to annual lung cancer LDCT screening. yes Impact of comorbidities on ability to participate in the program. yes Ability and willingness to under diagnostic treatment. yes  Smoking Cessation Counseling: Current Smokers:  Discussed importance of smoking cessation. yes Information about tobacco cessation classes and  interventions provided to patient. yes Patient provided with "ticket" for LDCT Scan. yes Symptomatic Patient. no  Counseling NA Diagnosis Code: Tobacco Use Z72.0 Asymptomatic Patient yes  Counseling (Intermediate counseling: > three minutes counseling) B0175 Former Smokers:  Discussed the importance of maintaining cigarette abstinence. yes Diagnosis Code: Personal History of Nicotine Dependence. Z02.585 Information about tobacco cessation classes and interventions provided to patient. Yes Patient provided with "ticket" for LDCT Scan. yes Written Order for Lung Cancer Screening with LDCT placed in Epic. Yes (CT Chest Lung Cancer Screening Low Dose W/O CM) [id number] Z12.2-Screening of respiratory organs Z87.891-Personal history of nicotine dependence  I spent 25 minutes of face to face time/virtual visit time  with  Ms. Swilling discussing the risks and benefits of lung cancer screening. We took the time to pause the power point at intervals to allow for questions to be asked and answered to ensure understanding. We discussed that she had taken the single most powerful action possible to decrease her risk of developing lung cancer when she quit smoking. I counseled her to remain smoke free, and to contact me if she ever had the desire to smoke again so that I can provide resources and tools to help support the effort to remain smoke free. We discussed the time and location of the scan, and that either  Doroteo Glassman RN, Joella Prince, RN or I  or I will call / send a letter with the results within  24-72 hours of receiving them. she has the office contact information in the event she needs to speak with me,  She verbalized understanding of all of the above and had no further questions upon leaving the office.     I explained to the patient that there has been  a high incidence of coronary artery disease noted on these exams. I explained that this is a non-gated exam therefore degree or severity  cannot be determined. This patient is on statin therapy. I have asked the patient to follow-up with their PCP regarding any incidental finding of coronary artery disease and management with diet or medication as they feel is clinically indicated. The patient verbalized understanding of the above and had no further questions.     Magdalen Spatz, NP 06/10/2022

## 2022-06-10 NOTE — Patient Instructions (Signed)
Thank you for participating in the [facility]. It was our pleasure to meet you today. We will call you with the results of your scan within the next few days. Your scan will be assigned a Lung RADS category score by the physicians reading the scans.  This Lung RADS score determines follow up scanning.  See below for description of categories, and follow up screening recommendations. We will be in touch to schedule your follow up screening annually or based on recommendations of our providers. We will fax a copy of your scan results to your Primary Care Physician, or the physician who referred you to the program, to ensure they have the results. Please call the office if you have any questions or concerns regarding your scanning experience or results.  Our office number is [phone]. Please speak with Denise Phelps, RN. , or  Denise Buckner RN, They are  our Lung Cancer Screening RN.'s If They are unavailable when you call, Please leave a message on the voice mail. We will return your call at our earliest convenience.This voice mail is monitored several times a day.  Remember, if your scan is normal, we will scan you annually as long as you continue to meet the criteria for the program. (Age 50-80, Current smoker or smoker who has quit within the last 15 years). If you are a smoker, remember, quitting is the single most powerful action that you can take to decrease your risk of lung cancer and other pulmonary, breathing related problems. We know quitting is hard, and we are here to help.  Please let us know if there is anything we can do to help you meet your goal of quitting. If you are a former smoker, congratulations. We are proud of you! Remain smoke free! Remember you can refer friends or family members through the number above.  We will screen them to make sure they meet criteria for the program. Thank you for helping us take better care of you by  participating in Lung Screening.  You can receive free nicotine replacement therapy ( patches, gum or mints) by calling 1-800-QUIT NOW. Please call so we can get you on the path to becoming  a non-smoker. I know it is hard, but you can do this!  Lung RADS Categories:  Lung RADS 1: no nodules or definitely non-concerning nodules.  Recommendation is for a repeat annual scan in 12 months.  Lung RADS 2:  nodules that are non-concerning in appearance and behavior with a very low likelihood of becoming an active cancer. Recommendation is for a repeat annual scan in 12 months.  Lung RADS 3: nodules that are probably non-concerning , includes nodules with a low likelihood of becoming an active cancer.  Recommendation is for a 6-month repeat screening scan. Often noted after an upper respiratory illness. We will be in touch to make sure you have no questions, and to schedule your 6-month scan.  Lung RADS 4 A: nodules with concerning findings, recommendation is most often for a follow up scan in 3 months or additional testing based on our provider's assessment of the scan. We will be in touch to make sure you have no questions and to schedule the recommended 3 month follow up scan.  Lung RADS 4 B:  indicates findings that are concerning. We will be in touch with you to schedule additional diagnostic testing based on our provider's  assessment of the scan.  Other options for assistance in smoking cessation (   As covered by your insurance benefits)  Hypnosis for smoking cessation  [employer]. [phone]  Acupuncture for smoking cessation  [facility] [employer] [phone]

## 2022-06-12 ENCOUNTER — Ambulatory Visit (HOSPITAL_COMMUNITY)
Admission: RE | Admit: 2022-06-12 | Discharge: 2022-06-12 | Disposition: A | Discharge status: Home / Self Care | Payer: Medicare Other | Source: Ambulatory Visit | Attending: Acute Care | Admitting: Acute Care

## 2022-06-12 DIAGNOSIS — Z122 Encounter for screening for malignant neoplasm of respiratory organs: Secondary | ICD-10-CM | POA: Insufficient documentation

## 2022-06-12 DIAGNOSIS — Z87891 Personal history of nicotine dependence: Secondary | ICD-10-CM

## 2022-06-15 ENCOUNTER — Other Ambulatory Visit: Payer: Self-pay | Admitting: Acute Care

## 2022-06-15 DIAGNOSIS — Z122 Encounter for screening for malignant neoplasm of respiratory organs: Secondary | ICD-10-CM

## 2022-06-15 DIAGNOSIS — Z87891 Personal history of nicotine dependence: Secondary | ICD-10-CM

## 2022-07-16 ENCOUNTER — Encounter: Payer: Self-pay | Admitting: Radiology

## 2022-07-27 DIAGNOSIS — E7849 Other hyperlipidemia: Secondary | ICD-10-CM | POA: Diagnosis not present

## 2022-07-27 DIAGNOSIS — I10 Essential (primary) hypertension: Secondary | ICD-10-CM | POA: Diagnosis not present

## 2022-07-27 DIAGNOSIS — E1122 Type 2 diabetes mellitus with diabetic chronic kidney disease: Secondary | ICD-10-CM | POA: Diagnosis not present

## 2022-07-27 DIAGNOSIS — R768 Other specified abnormal immunological findings in serum: Secondary | ICD-10-CM | POA: Diagnosis not present

## 2022-07-27 DIAGNOSIS — E1165 Type 2 diabetes mellitus with hyperglycemia: Secondary | ICD-10-CM | POA: Diagnosis not present

## 2022-08-03 DIAGNOSIS — E1165 Type 2 diabetes mellitus with hyperglycemia: Secondary | ICD-10-CM | POA: Diagnosis not present

## 2022-08-03 DIAGNOSIS — I700 Atherosclerosis of aorta: Secondary | ICD-10-CM | POA: Diagnosis not present

## 2022-08-03 DIAGNOSIS — K2100 Gastro-esophageal reflux disease with esophagitis, without bleeding: Secondary | ICD-10-CM | POA: Diagnosis not present

## 2022-08-03 DIAGNOSIS — N179 Acute kidney failure, unspecified: Secondary | ICD-10-CM | POA: Diagnosis not present

## 2022-08-03 DIAGNOSIS — E7849 Other hyperlipidemia: Secondary | ICD-10-CM | POA: Diagnosis not present

## 2022-08-03 DIAGNOSIS — R202 Paresthesia of skin: Secondary | ICD-10-CM | POA: Diagnosis not present

## 2022-08-03 DIAGNOSIS — M791 Myalgia, unspecified site: Secondary | ICD-10-CM | POA: Diagnosis not present

## 2022-08-03 DIAGNOSIS — R768 Other specified abnormal immunological findings in serum: Secondary | ICD-10-CM | POA: Diagnosis not present

## 2022-08-03 DIAGNOSIS — D860 Sarcoidosis of lung: Secondary | ICD-10-CM | POA: Diagnosis not present

## 2022-08-03 DIAGNOSIS — H44131 Sympathetic uveitis, right eye: Secondary | ICD-10-CM | POA: Diagnosis not present

## 2022-08-03 DIAGNOSIS — I10 Essential (primary) hypertension: Secondary | ICD-10-CM | POA: Diagnosis not present

## 2022-08-03 DIAGNOSIS — T8182XA Emphysema (subcutaneous) resulting from a procedure, initial encounter: Secondary | ICD-10-CM | POA: Diagnosis not present

## 2022-08-11 DIAGNOSIS — Z780 Asymptomatic menopausal state: Secondary | ICD-10-CM | POA: Diagnosis not present

## 2022-08-21 DIAGNOSIS — E1122 Type 2 diabetes mellitus with diabetic chronic kidney disease: Secondary | ICD-10-CM | POA: Diagnosis not present

## 2022-08-21 DIAGNOSIS — N189 Chronic kidney disease, unspecified: Secondary | ICD-10-CM | POA: Diagnosis not present

## 2022-08-21 DIAGNOSIS — I5032 Chronic diastolic (congestive) heart failure: Secondary | ICD-10-CM | POA: Diagnosis not present

## 2022-08-21 DIAGNOSIS — N186 End stage renal disease: Secondary | ICD-10-CM | POA: Diagnosis not present

## 2022-08-21 DIAGNOSIS — I129 Hypertensive chronic kidney disease with stage 1 through stage 4 chronic kidney disease, or unspecified chronic kidney disease: Secondary | ICD-10-CM | POA: Diagnosis not present

## 2022-08-21 DIAGNOSIS — G4733 Obstructive sleep apnea (adult) (pediatric): Secondary | ICD-10-CM | POA: Diagnosis not present

## 2022-08-26 DIAGNOSIS — Z7984 Long term (current) use of oral hypoglycemic drugs: Secondary | ICD-10-CM | POA: Diagnosis not present

## 2022-08-26 DIAGNOSIS — Z961 Presence of intraocular lens: Secondary | ICD-10-CM | POA: Diagnosis not present

## 2022-08-26 DIAGNOSIS — E119 Type 2 diabetes mellitus without complications: Secondary | ICD-10-CM | POA: Diagnosis not present

## 2022-09-01 DIAGNOSIS — Z1231 Encounter for screening mammogram for malignant neoplasm of breast: Secondary | ICD-10-CM | POA: Diagnosis not present

## 2022-09-11 ENCOUNTER — Encounter (INDEPENDENT_AMBULATORY_CARE_PROVIDER_SITE_OTHER): Payer: Self-pay | Admitting: *Deleted

## 2022-09-16 DIAGNOSIS — N189 Chronic kidney disease, unspecified: Secondary | ICD-10-CM | POA: Diagnosis not present

## 2022-09-23 DIAGNOSIS — N1831 Chronic kidney disease, stage 3a: Secondary | ICD-10-CM | POA: Diagnosis not present

## 2022-09-23 DIAGNOSIS — I5032 Chronic diastolic (congestive) heart failure: Secondary | ICD-10-CM | POA: Diagnosis not present

## 2022-09-23 DIAGNOSIS — G4733 Obstructive sleep apnea (adult) (pediatric): Secondary | ICD-10-CM | POA: Diagnosis not present

## 2022-09-23 DIAGNOSIS — I129 Hypertensive chronic kidney disease with stage 1 through stage 4 chronic kidney disease, or unspecified chronic kidney disease: Secondary | ICD-10-CM | POA: Diagnosis not present

## 2022-09-24 ENCOUNTER — Telehealth (INDEPENDENT_AMBULATORY_CARE_PROVIDER_SITE_OTHER): Payer: Self-pay | Admitting: Gastroenterology

## 2022-09-24 DIAGNOSIS — Z1211 Encounter for screening for malignant neoplasm of colon: Secondary | ICD-10-CM

## 2022-09-24 NOTE — Telephone Encounter (Signed)
Any room Thanks 

## 2022-09-24 NOTE — Telephone Encounter (Signed)
Who is your primary care physician: Dr.Paul Sasser  Reasons for the colonoscopy: 5 year recall  Have you had a colonoscopy before?  Yes 5 years ago  Do you have family history of colon cancer? no  Previous colonoscopy with polyps removed? Yes 20 years ago  Do you have a history colorectal cancer?   no  Are you diabetic? If yes, Type 1 or Type 2?    Yes type 2  Do you have a prosthetic or mechanical heart valve? no  Do you have a pacemaker/defibrillator?   no  Have you had endocarditis/atrial fibrillation? no  Have you had joint replacement within the last 12 months?  no  Do you tend to be constipated or have to use laxatives? no  Do you have any history of drugs or alchohol?  no  Do you use supplemental oxygen?  no  Have you had a stroke or heart attack within the last 6 months? no  Do you take weight loss medication?  no  For female patients: have you had a hysterectomy?  yes                                     are you post menopausal?       no                                            do you still have your menstrual cycle? no      Do you take any blood-thinning medications such as: (aspirin, warfarin, Plavix, Aggrenox)  yes Aspirin 81 mg Four time a week  If yes we need the name, milligram, dosage and who is prescribing doctor Dr.Sasser Current Outpatient Medications on File Prior to Visit  Medication Sig Dispense Refill   amlodipine-atorvastatin (CADUET) 10-10 MG tablet Take 1 tablet by mouth daily.     ASPIRIN LOW DOSE 81 MG tablet Take 81 mg by mouth daily.     carvedilol (COREG) 6.25 MG tablet Take 6.25 mg by mouth daily.     ezetimibe (ZETIA) 10 MG tablet Take 10 mg by mouth daily.     fluticasone (FLONASE) 50 MCG/ACT nasal spray Place 2 sprays into both nostrils daily as needed for allergies or rhinitis.     folic acid (FOLVITE) 1 MG tablet Take 3 mg by mouth once a week.     furosemide (LASIX) 20 MG tablet Take 20 mg by mouth daily.      glipiZIDE (GLUCOTROL  XL) 5 MG 24 hr tablet Take 5 mg by mouth daily with breakfast.     pantoprazole (PROTONIX) 40 MG tablet Take 40 mg by mouth daily as needed for heartburn or indigestion.      rosuvastatin (CRESTOR) 5 MG tablet Take 5 mg by mouth 3 (three) times a week.     valsartan (DIOVAN) 40 MG tablet Take 40 mg by mouth daily.     No current facility-administered medications on file prior to visit.    Allergies  Allergen Reactions   Codeine Hives     Pharmacy: [facility]  Primary Insurance Name: [facility] [employer]  Best number where you can be reached: [phone]

## 2022-09-25 ENCOUNTER — Telehealth (INDEPENDENT_AMBULATORY_CARE_PROVIDER_SITE_OTHER): Payer: Self-pay | Admitting: Gastroenterology

## 2022-09-25 NOTE — Telephone Encounter (Signed)
Left message to return call 

## 2022-09-28 NOTE — Telephone Encounter (Signed)
Error

## 2022-09-30 MED ORDER — PEG 3350-KCL-NA BICARB-NACL 420 G PO SOLR: 4000.0000 mL | Freq: Once | ORAL | 0 refills | Status: AC

## 2022-09-30 NOTE — Telephone Encounter (Signed)
Pt contacted and TCS scheduled for 11/04/22 at 10:30am. Instructions will be mailed to patient. Prep sent to pharmacy  Per [facility]- Notification or Prior Authorization is not required for the requested services You are not required to submit a notification/prior authorization based on the information provided. If you have general questions about the prior authorization requirements, visit [email or web address] > Clinician Resources > Advance and Admission Notification Requirements. The number above acknowledges your notification. Please write this reference number down for future reference. If you would like to request an organization determination, please call us at [phone]. Decision ID #: [id number]

## 2022-09-30 NOTE — Addendum Note (Signed)
Addended by: Marlowe Shores on: 09/30/2022 02:37 PM   Modules accepted: Orders

## 2022-10-02 ENCOUNTER — Ambulatory Visit: Payer: Medicare Other | Admitting: Pulmonary Disease

## 2022-10-02 ENCOUNTER — Encounter: Payer: Self-pay | Admitting: Pulmonary Disease

## 2022-10-02 VITALS — BP 116/66 | HR 75 | Ht 66.0 in | Wt 184.4 lb

## 2022-10-02 DIAGNOSIS — G4733 Obstructive sleep apnea (adult) (pediatric): Secondary | ICD-10-CM | POA: Diagnosis not present

## 2022-10-02 DIAGNOSIS — D869 Sarcoidosis, unspecified: Secondary | ICD-10-CM | POA: Diagnosis not present

## 2022-10-02 NOTE — Patient Instructions (Signed)
X Delsym as needed for cough  Annual low dose Ct scan

## 2022-10-02 NOTE — Progress Notes (Signed)
   Subjective:    Patient ID: Kristen Mcknight, female    DOB: 11/11/1950, 72 y.o.   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     PMH -hypertension on 2 medications CKD stage III Diabetes type 2 Sarcoidosis-diagnosed 25 years ago, in remission  Chief Complaint  Patient presents with   Follow-up    Pt f/u states that she never started CPAP therapy because insurance wouldn't pay for machine. Only complaint today is a lingering cough w/ white sputum.    She developed URI symptoms 3 weeks ago, cough is still persistent mostly dry now, was initially productive of clear sputum.  She took Mucinex cough syrup over-the-counter.  She did not test for COVID. Initial office visit 02/2022 -we discussed implications of mild OSA I gave her the option of CPAP therapy.  This was not covered by insurance and she decided not to get this.  She is not bothered by gasping or choking episodes.  She denies loud snoring. 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 Also denies presyncope, palpitations, heartburn, abdominal pain, nausea, vomiting, diarrhea or change in bowel or urinary habits, dysuria,hematuria, rash, arthralgias, visual complaints, headache, numbness weakness or ataxia.     Objective:   Physical Exam  Gen. Pleasant, obese, in no distress ENT - no lesions, no post nasal  drip Neck: No JVD, no thyromegaly, no carotid bruits Lungs: no use of accessory muscles, no dullness to percussion, decreased without rales or rhonchi  Cardiovascular: Rhythm regular, heart sounds  normal, no murmurs or gallops, no peripheral edema Musculoskeletal: No deformities, no cyanosis or clubbing , no tremors       Assessment & Plan:

## 2022-10-02 NOTE — Assessment & Plan Note (Signed)
3 mm nodule in right upper lobe, no significant mediastinal lymphadenopathy on CT chest. We will follow-up with annual low-dose CT chest

## 2022-10-02 NOTE — Assessment & Plan Note (Signed)
Very mild and she does not want treatment at this time. 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

## 2022-10-08 NOTE — Telephone Encounter (Signed)
Questionnaire from recall, no referral needed  

## 2022-10-20 ENCOUNTER — Telehealth (INDEPENDENT_AMBULATORY_CARE_PROVIDER_SITE_OTHER): Payer: Self-pay | Admitting: Gastroenterology

## 2022-10-20 ENCOUNTER — Encounter (INDEPENDENT_AMBULATORY_CARE_PROVIDER_SITE_OTHER): Payer: Self-pay

## 2022-10-20 NOTE — Telephone Encounter (Signed)
Pt left voicemail needing to reschedule TCS. Original date 11/04/22 at 10:30 am. Pt has been rescheduled to 11/25/22 at 9am. Updated instructions mailed to pt.

## 2022-11-10 DIAGNOSIS — I10 Essential (primary) hypertension: Secondary | ICD-10-CM | POA: Diagnosis not present

## 2022-11-10 DIAGNOSIS — E1122 Type 2 diabetes mellitus with diabetic chronic kidney disease: Secondary | ICD-10-CM | POA: Diagnosis not present

## 2022-11-10 DIAGNOSIS — E782 Mixed hyperlipidemia: Secondary | ICD-10-CM | POA: Diagnosis not present

## 2022-11-10 DIAGNOSIS — E7849 Other hyperlipidemia: Secondary | ICD-10-CM | POA: Diagnosis not present

## 2022-11-10 DIAGNOSIS — E1165 Type 2 diabetes mellitus with hyperglycemia: Secondary | ICD-10-CM | POA: Diagnosis not present

## 2022-11-17 DIAGNOSIS — H44131 Sympathetic uveitis, right eye: Secondary | ICD-10-CM | POA: Diagnosis not present

## 2022-11-17 DIAGNOSIS — R202 Paresthesia of skin: Secondary | ICD-10-CM | POA: Diagnosis not present

## 2022-11-17 DIAGNOSIS — E7849 Other hyperlipidemia: Secondary | ICD-10-CM | POA: Diagnosis not present

## 2022-11-17 DIAGNOSIS — I700 Atherosclerosis of aorta: Secondary | ICD-10-CM | POA: Diagnosis not present

## 2022-11-17 DIAGNOSIS — N179 Acute kidney failure, unspecified: Secondary | ICD-10-CM | POA: Diagnosis not present

## 2022-11-17 DIAGNOSIS — D860 Sarcoidosis of lung: Secondary | ICD-10-CM | POA: Diagnosis not present

## 2022-11-17 DIAGNOSIS — K2100 Gastro-esophageal reflux disease with esophagitis, without bleeding: Secondary | ICD-10-CM | POA: Diagnosis not present

## 2022-11-17 DIAGNOSIS — T8182XA Emphysema (subcutaneous) resulting from a procedure, initial encounter: Secondary | ICD-10-CM | POA: Diagnosis not present

## 2022-11-17 DIAGNOSIS — M791 Myalgia, unspecified site: Secondary | ICD-10-CM | POA: Diagnosis not present

## 2022-11-17 DIAGNOSIS — E1165 Type 2 diabetes mellitus with hyperglycemia: Secondary | ICD-10-CM | POA: Diagnosis not present

## 2022-11-17 DIAGNOSIS — R768 Other specified abnormal immunological findings in serum: Secondary | ICD-10-CM | POA: Diagnosis not present

## 2022-11-17 DIAGNOSIS — I10 Essential (primary) hypertension: Secondary | ICD-10-CM | POA: Diagnosis not present

## 2022-11-25 ENCOUNTER — Encounter (HOSPITAL_COMMUNITY)
Admission: RE | Disposition: A | Discharge status: Home / Self Care | Payer: Self-pay | Source: Home / Self Care | Attending: Gastroenterology

## 2022-11-25 ENCOUNTER — Other Ambulatory Visit: Payer: Self-pay

## 2022-11-25 ENCOUNTER — Ambulatory Visit (HOSPITAL_COMMUNITY): Payer: Medicare Other | Admitting: Anesthesiology

## 2022-11-25 ENCOUNTER — Encounter (INDEPENDENT_AMBULATORY_CARE_PROVIDER_SITE_OTHER): Payer: Self-pay | Admitting: *Deleted

## 2022-11-25 ENCOUNTER — Ambulatory Visit (HOSPITAL_BASED_OUTPATIENT_CLINIC_OR_DEPARTMENT_OTHER): Payer: Medicare Other | Admitting: Anesthesiology

## 2022-11-25 ENCOUNTER — Ambulatory Visit (HOSPITAL_COMMUNITY)
Admission: RE | Admit: 2022-11-25 | Discharge: 2022-11-25 | Disposition: A | Discharge status: Home / Self Care | Payer: Medicare Other | Attending: Gastroenterology | Admitting: Gastroenterology

## 2022-11-25 ENCOUNTER — Encounter (HOSPITAL_COMMUNITY): Payer: Self-pay | Admitting: Gastroenterology

## 2022-11-25 DIAGNOSIS — Z79899 Other long term (current) drug therapy: Secondary | ICD-10-CM | POA: Insufficient documentation

## 2022-11-25 DIAGNOSIS — N189 Chronic kidney disease, unspecified: Secondary | ICD-10-CM | POA: Insufficient documentation

## 2022-11-25 DIAGNOSIS — Z1211 Encounter for screening for malignant neoplasm of colon: Secondary | ICD-10-CM | POA: Diagnosis not present

## 2022-11-25 DIAGNOSIS — E1122 Type 2 diabetes mellitus with diabetic chronic kidney disease: Secondary | ICD-10-CM | POA: Diagnosis not present

## 2022-11-25 DIAGNOSIS — Z87891 Personal history of nicotine dependence: Secondary | ICD-10-CM | POA: Diagnosis not present

## 2022-11-25 DIAGNOSIS — D869 Sarcoidosis, unspecified: Secondary | ICD-10-CM | POA: Diagnosis not present

## 2022-11-25 DIAGNOSIS — K5730 Diverticulosis of large intestine without perforation or abscess without bleeding: Secondary | ICD-10-CM | POA: Diagnosis not present

## 2022-11-25 DIAGNOSIS — E785 Hyperlipidemia, unspecified: Secondary | ICD-10-CM | POA: Insufficient documentation

## 2022-11-25 DIAGNOSIS — I2510 Atherosclerotic heart disease of native coronary artery without angina pectoris: Secondary | ICD-10-CM | POA: Diagnosis not present

## 2022-11-25 DIAGNOSIS — D126 Benign neoplasm of colon, unspecified: Secondary | ICD-10-CM

## 2022-11-25 DIAGNOSIS — D122 Benign neoplasm of ascending colon: Secondary | ICD-10-CM | POA: Diagnosis not present

## 2022-11-25 DIAGNOSIS — Z7984 Long term (current) use of oral hypoglycemic drugs: Secondary | ICD-10-CM | POA: Insufficient documentation

## 2022-11-25 DIAGNOSIS — Z139 Encounter for screening, unspecified: Secondary | ICD-10-CM | POA: Diagnosis not present

## 2022-11-25 DIAGNOSIS — D125 Benign neoplasm of sigmoid colon: Secondary | ICD-10-CM | POA: Insufficient documentation

## 2022-11-25 DIAGNOSIS — K635 Polyp of colon: Secondary | ICD-10-CM | POA: Diagnosis not present

## 2022-11-25 DIAGNOSIS — Z86010 Personal history of colonic polyps: Secondary | ICD-10-CM | POA: Insufficient documentation

## 2022-11-25 DIAGNOSIS — B1920 Unspecified viral hepatitis C without hepatic coma: Secondary | ICD-10-CM | POA: Insufficient documentation

## 2022-11-25 DIAGNOSIS — K648 Other hemorrhoids: Secondary | ICD-10-CM | POA: Insufficient documentation

## 2022-11-25 HISTORY — PX: COLONOSCOPY WITH PROPOFOL: SHX5780

## 2022-11-25 HISTORY — PX: POLYPECTOMY: SHX5525

## 2022-11-25 LAB — HM COLONOSCOPY

## 2022-11-25 LAB — GLUCOSE, CAPILLARY: Glucose-Capillary: 133 mg/dL — ABNORMAL HIGH (ref 70–99)

## 2022-11-25 SURGERY — COLONOSCOPY WITH PROPOFOL
Anesthesia: General

## 2022-11-25 MED ORDER — LIDOCAINE HCL (CARDIAC) PF 100 MG/5ML IV SOSY
PREFILLED_SYRINGE | INTRAVENOUS | Status: DC | PRN
  Administered 2022-11-25: 50 mg via INTRAVENOUS

## 2022-11-25 MED ORDER — PROPOFOL 500 MG/50ML IV EMUL
INTRAVENOUS | Status: DC | PRN
  Administered 2022-11-25: 150 ug/kg/min via INTRAVENOUS

## 2022-11-25 MED ORDER — PROPOFOL 10 MG/ML IV BOLUS
INTRAVENOUS | Status: DC | PRN
  Administered 2022-11-25: 100 mg via INTRAVENOUS
  Administered 2022-11-25: 30 mg via INTRAVENOUS

## 2022-11-25 MED ORDER — LACTATED RINGERS IV SOLN
INTRAVENOUS | Status: DC
  Administered 2022-11-25: 1000 mL via INTRAVENOUS

## 2022-11-25 NOTE — Anesthesia Postprocedure Evaluation (Signed)
Anesthesia Post Note  Patient: Estell Puccini  Procedure(s) Performed: COLONOSCOPY WITH PROPOFOL POLYPECTOMY  Patient location during evaluation: Phase II Anesthesia Type: General Level of consciousness: awake and alert and oriented Pain management: pain level controlled Vital Signs Assessment: post-procedure vital signs reviewed and stable Respiratory status: spontaneous breathing, nonlabored ventilation and respiratory function stable Cardiovascular status: blood pressure returned to baseline and stable Postop Assessment: no apparent nausea or vomiting Anesthetic complications: no  No notable events documented.   Last Vitals:  Vitals:   11/25/22 0738 11/25/22 0850  BP: 139/75 (!) 133/59  Pulse: 61 60  Resp: 16 16  Temp: 36.8 C 36.4 C  SpO2: 95% 96%    Last Pain:  Vitals:   11/25/22 0850  TempSrc: Oral  PainSc: 0-No pain                 Kaylah Chiasson C Lular Letson

## 2022-11-25 NOTE — Transfer of Care (Signed)
Immediate Anesthesia Transfer of Care Note  Patient: Kristen Mcknight  Procedure(s) Performed: COLONOSCOPY WITH PROPOFOL POLYPECTOMY  Patient Location: Endoscopy Unit  Anesthesia Type:General  Level of Consciousness: awake  Airway & Oxygen Therapy: Patient Spontanous Breathing  Post-op Assessment: Report given to RN and Post -op Vital signs reviewed and stable  Post vital signs: Reviewed and stable  Last Vitals:  Vitals Value Taken Time  BP    Temp    Pulse    Resp    SpO2      Last Pain:  Vitals:   11/25/22 0820  TempSrc:   PainSc: 0-No pain      Patients Stated Pain Goal: 9 (11/25/22 0738)  Complications: No notable events documented.

## 2022-11-25 NOTE — Op Note (Addendum)
[facility] Patient Name: Kristen Mcknight Procedure Date: 11/25/2022 8:09 AM MRN: [id number] Date of Birth: 1951/02/12 Attending MD: Katrinka Blazing , , [id number] CSN: [id number] Age: 72 Admit Type: Outpatient Procedure:                Colonoscopy Indications:              High risk colon cancer surveillance: Personal                            history of sessile serrated colon polyp (less than                            10 mm in size) with no dysplasia Providers:                Katrinka Blazing, Angelica Ran, Pandora Leiter,                            Technician Referring MD:              Medicines:                Monitored Anesthesia Care Complications:            No immediate complications. Estimated Blood Loss:     Estimated blood loss: none. Procedure:                Pre-Anesthesia Assessment:                           - Prior to the procedure, a History and Physical                            was performed, and patient medications, allergies                            and sensitivities were reviewed. The patient's                            tolerance of previous anesthesia was reviewed.                           - The risks and benefits of the procedure and the                            sedation options and risks were discussed with the                            patient. All questions were answered and informed                            consent was obtained.                           - ASA Grade Assessment: II - A patient with mild                            systemic disease.  After obtaining informed consent, the colonoscope                            was passed under direct vision. Throughout the                            procedure, the patient's blood pressure, pulse, and                            oxygen saturations were monitored continuously. The                            [id number]-[id number] ([id number]) scope was introduced through                             the anus and advanced to the the cecum, identified                            by appendiceal orifice and ileocecal valve. The                            colonoscopy was performed without difficulty. The                            patient tolerated the procedure well. The quality                            of the bowel preparation was good. Scope In: 8:24:25 AM Scope Out: 8:45:48 AM Scope Withdrawal Time: 0 hours 13 minutes 13 seconds  Total Procedure Duration: 0 hours 21 minutes 23 seconds  Findings:      The perianal and digital rectal examinations were normal.      A 2 mm polyp was found in the ascending colon. The polyp was sessile.       The polyp was removed with a cold snare. Resection and retrieval were       complete.      Two pedunculated and sessile polyps were found in the sigmoid colon. The       polyps were 5 to 8 mm in size. These polyps were removed with a cold       snare. Resection and retrieval were complete.      A few small-mouthed diverticula were found in the sigmoid colon.      Non-bleeding internal hemorrhoids were found during retroflexion. The       hemorrhoids were small. Impression:               - One 2 mm polyp in the ascending colon, removed                            with a cold snare. Resected and retrieved.                           - Two 5 to 8 mm polyps in the sigmoid colon,  removed with a cold snare. Resected and retrieved.                           - Diverticulosis in the sigmoid colon.                           - Non-bleeding internal hemorrhoids. Moderate Sedation:      Per Anesthesia Care Recommendation:           - Discharge patient to home (ambulatory).                           - Resume previous diet.                           - Await pathology results.                           - Repeat colonoscopy date to be determined after                            pending pathology results are reviewed for                             surveillance. Procedure Code(s):        --- Professional ---                           [phone], Colonoscopy, flexible; with removal of                            tumor(s), polyp(s), or other lesion(s) by snare                            technique Diagnosis Code(s):        --- Professional ---                           Z86.010, Personal history of colonic polyps                           D12.2, Benign neoplasm of ascending colon                           D12.5, Benign neoplasm of sigmoid colon                           K64.8, Other hemorrhoids                           K57.30, Diverticulosis of large intestine without                            perforation or abscess without bleeding CPT copyright 2022 American Medical Association. All rights reserved. The codes documented in this report are preliminary and upon coder review may  be revised to meet current compliance requirements. Katrinka Blazing, MD Katrinka Blazing,  11/25/2022 8:53:29 AM This  report has been signed electronically. Number of Addenda: 0

## 2022-11-25 NOTE — Discharge Instructions (Signed)
You are being discharged to home.  Resume your previous diet.  We are waiting for your pathology results.  Your physician has recommended a repeat colonoscopy (date to be determined after pending pathology results are reviewed) for surveillance.  

## 2022-11-25 NOTE — Anesthesia Preprocedure Evaluation (Signed)
Anesthesia Evaluation  Patient identified by MRN, date of birth, ID band Patient awake    Reviewed: Allergy & Precautions, H&P , NPO status , Patient's Chart, lab work & pertinent test results, reviewed documented beta blocker date and time   Airway Mallampati: II  TM Distance: >3 FB Neck ROM: Full    Dental no notable dental hx. (+) Dental Advisory Given, Teeth Intact   Pulmonary sleep apnea , former smoker   Pulmonary exam normal breath sounds clear to auscultation       Cardiovascular + CAD (mild disease, on beta blockers)  Normal cardiovascular exam Rhythm:Regular Rate:Normal     Neuro/Psych negative neurological ROS  negative psych ROS   GI/Hepatic negative GI ROS,,,(+) Hepatitis -, C  Endo/Other  diabetes, Well Controlled, Type 2, Oral Hypoglycemic Agents    Renal/GU Renal InsufficiencyRenal disease  negative genitourinary   Musculoskeletal  (+) Arthritis , Osteoarthritis,    Abdominal   Peds negative pediatric ROS (+)  Hematology  (+) Blood dyscrasia, anemia   Anesthesia Other Findings Sarcoidosis  Low back pain  Reproductive/Obstetrics negative OB ROS                             Anesthesia Physical Anesthesia Plan  ASA: 2  Anesthesia Plan: General   Post-op Pain Management: Minimal or no pain anticipated   Induction:   PONV Risk Score and Plan: 1 and Propofol infusion  Airway Management Planned: Nasal Cannula and Natural Airway  Additional Equipment:   Intra-op Plan:   Post-operative Plan:   Informed Consent: I have reviewed the patients History and Physical, chart, labs and discussed the procedure including the risks, benefits and alternatives for the proposed anesthesia with the patient or authorized representative who has indicated his/her understanding and acceptance.     Dental advisory given  Plan Discussed with: CRNA and Surgeon  Anesthesia Plan  Comments:        Anesthesia Quick Evaluation

## 2022-11-25 NOTE — Anesthesia Procedure Notes (Signed)
Date/Time: 11/25/2022 8:27 AM  Performed by: Julian Reil, CRNAPre-anesthesia Checklist: Patient identified, Emergency Drugs available, Suction available and Patient being monitored Patient Re-evaluated:Patient Re-evaluated prior to induction Oxygen Delivery Method: Nasal cannula Induction Type: IV induction Placement Confirmation: positive ETCO2

## 2022-11-25 NOTE — H&P (Signed)
Kristen Mcknight is an 72 y.o. female.   Chief Complaint: history of colon polyps HPI: 72 y/o F with past medical history of CKD, diabetes, hyperlipidemia, sarcoidosis, coming for history of colon polyps.  Last colonoscopy in 2019 showed 1 polyp that was combination of serrated and tubular adenoma histology.  The patient denies having any complaints such as melena, hematochezia, abdominal pain or distention, change in her bowel movement consistency or frequency, no changes in weight recently.  No family history of colorectal cancer.   Past Medical History:  Diagnosis Date   Anemia    Chronic kidney disease    DM (diabetes mellitus) (HCC)     x 1 year   Hepatitis    Hyperlipidemia    Sarcoidosis     Past Surgical History:  Procedure Laterality Date   ABDOMINAL HYSTERECTOMY     CATARACT EXTRACTION W/PHACO Right 05/06/2018   Procedure: CATARACT EXTRACTION PHACO AND INTRAOCULAR LENS PLACEMENT RIGHT EYE;  Surgeon: Fabio Pierce, MD;  Location: [facility];  Service: Ophthalmology;  Laterality: Right;  right   COLONOSCOPY N/A 09/15/2017   Procedure: COLONOSCOPY;  Surgeon: Malissa Hippo, MD;  Location: [facility];  Service: Endoscopy;  Laterality: N/A;  8:30   POLYPECTOMY  09/15/2017   Procedure: POLYPECTOMY;  Surgeon: Malissa Hippo, MD;  Location: [facility];  Service: Endoscopy;;    Family History  Problem Relation Age of Onset   Asthma Mother    CAD Mother    Lung cancer Father    Lupus Other    Social History:  reports that she quit smoking about 8 years ago. Her smoking use included cigarettes. She has a 30.00 pack-year smoking history. She has never used smokeless tobacco. She reports that she does not drink alcohol and does not use drugs.  Allergies:  Allergies  Allergen Reactions   Iodinated Contrast Media Other (See Comments)    Pt states possible allergy/ left side tingling/ swelling of arm   Pt states possible allergy/ left side tingling/ swelling of arm   Codeine  Hives    Medications Prior to Admission  Medication Sig Dispense Refill   ASPIRIN LOW DOSE 81 MG tablet Take 81 mg by mouth every other day. In the morning     carvedilol (COREG) 6.25 MG tablet Take 6.25 mg by mouth in the morning and at bedtime.     COD LIVER OIL PO Take 1 capsule by mouth 3 (three) times a week.     ezetimibe (ZETIA) 10 MG tablet Take 10 mg by mouth in the morning.     fluorometholone (FML) 0.1 % ophthalmic suspension Place 1 drop into both eyes 3 (three) times daily as needed (seeing floaters/eye pain.).     fluticasone (FLONASE) 50 MCG/ACT nasal spray Place 2 sprays into both nostrils daily as needed for allergies or rhinitis.     glipiZIDE (GLUCOTROL XL) 5 MG 24 hr tablet Take 5 mg by mouth daily with breakfast.     Multiple Vitamin (MULTIVITAMIN WITH MINERALS) TABS tablet Take 1 tablet by mouth 3 (three) times a week.     rosuvastatin (CRESTOR) 10 MG tablet Take 5 mg by mouth every other day.     valsartan (DIOVAN) 40 MG tablet Take 40 mg by mouth in the morning.     FOLIC ACID PO Take 1 tablet by mouth every other day.      Results for orders placed or performed during the hospital encounter of 11/25/22 (from the past 48 hour(s))  Glucose, capillary     Status: Abnormal   Collection Time: 11/25/22  7:28 AM  Result Value Ref Range   Glucose-Capillary 133 (H) 70 - 99 mg/dL    Comment: Glucose reference range applies only to samples taken after fasting for at least 8 hours.   No results found.  Review of Systems  All other systems reviewed and are negative.   Blood pressure 139/75, pulse 61, temperature 98.2 F (36.8 C), temperature source Oral, resp. rate 16, height 5\' 6"  (1.676 m), weight 81.6 kg, SpO2 95 %. Physical Exam  GENERAL: The patient is AO x3, in no acute distress. HEENT: Head is normocephalic and atraumatic. EOMI are intact. Mouth is well hydrated and without lesions. NECK: Supple. No masses LUNGS: Clear to auscultation. No presence of  rhonchi/wheezing/rales. Adequate chest expansion HEART: RRR, normal s1 and s2. ABDOMEN: Soft, nontender, no guarding, no peritoneal signs, and nondistended. BS +. No masses. EXTREMITIES: Without any cyanosis, clubbing, rash, lesions or edema. NEUROLOGIC: AOx3, no focal motor deficit. SKIN: no jaundice, no rashes  Assessment/Plan 72 y/o F with past medical history of CKD, diabetes, hyperlipidemia, sarcoidosis, coming for history of colon polyps.  Will proceed with colonoscopy.  Dolores Frame, MD 11/25/2022, 8:17 AM

## 2022-11-26 ENCOUNTER — Encounter (INDEPENDENT_AMBULATORY_CARE_PROVIDER_SITE_OTHER): Payer: Self-pay | Admitting: *Deleted

## 2022-11-26 LAB — SURGICAL PATHOLOGY

## 2022-12-01 ENCOUNTER — Encounter (HOSPITAL_COMMUNITY): Payer: Self-pay | Admitting: Gastroenterology

## 2022-12-01 DIAGNOSIS — M9903 Segmental and somatic dysfunction of lumbar region: Secondary | ICD-10-CM | POA: Diagnosis not present

## 2022-12-01 DIAGNOSIS — S338XXA Sprain of other parts of lumbar spine and pelvis, initial encounter: Secondary | ICD-10-CM | POA: Diagnosis not present

## 2022-12-01 DIAGNOSIS — M9901 Segmental and somatic dysfunction of cervical region: Secondary | ICD-10-CM | POA: Diagnosis not present

## 2022-12-01 DIAGNOSIS — M9902 Segmental and somatic dysfunction of thoracic region: Secondary | ICD-10-CM | POA: Diagnosis not present

## 2022-12-01 DIAGNOSIS — S134XXA Sprain of ligaments of cervical spine, initial encounter: Secondary | ICD-10-CM | POA: Diagnosis not present

## 2022-12-08 DIAGNOSIS — M9903 Segmental and somatic dysfunction of lumbar region: Secondary | ICD-10-CM | POA: Diagnosis not present

## 2022-12-08 DIAGNOSIS — M9901 Segmental and somatic dysfunction of cervical region: Secondary | ICD-10-CM | POA: Diagnosis not present

## 2022-12-08 DIAGNOSIS — S134XXA Sprain of ligaments of cervical spine, initial encounter: Secondary | ICD-10-CM | POA: Diagnosis not present

## 2022-12-08 DIAGNOSIS — S338XXA Sprain of other parts of lumbar spine and pelvis, initial encounter: Secondary | ICD-10-CM | POA: Diagnosis not present

## 2022-12-08 DIAGNOSIS — M9902 Segmental and somatic dysfunction of thoracic region: Secondary | ICD-10-CM | POA: Diagnosis not present

## 2022-12-15 DIAGNOSIS — S338XXA Sprain of other parts of lumbar spine and pelvis, initial encounter: Secondary | ICD-10-CM | POA: Diagnosis not present

## 2022-12-15 DIAGNOSIS — M9902 Segmental and somatic dysfunction of thoracic region: Secondary | ICD-10-CM | POA: Diagnosis not present

## 2022-12-15 DIAGNOSIS — S134XXA Sprain of ligaments of cervical spine, initial encounter: Secondary | ICD-10-CM | POA: Diagnosis not present

## 2022-12-15 DIAGNOSIS — M9901 Segmental and somatic dysfunction of cervical region: Secondary | ICD-10-CM | POA: Diagnosis not present

## 2022-12-15 DIAGNOSIS — M9903 Segmental and somatic dysfunction of lumbar region: Secondary | ICD-10-CM | POA: Diagnosis not present

## 2023-01-14 ENCOUNTER — Other Ambulatory Visit (INDEPENDENT_AMBULATORY_CARE_PROVIDER_SITE_OTHER): Payer: Medicare Other

## 2023-01-14 ENCOUNTER — Ambulatory Visit (INDEPENDENT_AMBULATORY_CARE_PROVIDER_SITE_OTHER): Payer: Medicare Other | Admitting: Orthopaedic Surgery

## 2023-01-14 ENCOUNTER — Encounter: Payer: Self-pay | Admitting: Orthopaedic Surgery

## 2023-01-14 VITALS — Ht 66.0 in | Wt 180.0 lb

## 2023-01-14 DIAGNOSIS — M1611 Unilateral primary osteoarthritis, right hip: Secondary | ICD-10-CM

## 2023-01-14 NOTE — Addendum Note (Signed)
Addended by: Jodene Nam A on: 01/14/2023 01:01 PM   Modules accepted: Orders

## 2023-01-14 NOTE — Progress Notes (Signed)
My hip is hurting again.  She has recurrence of right hip pain.  She had this about two years ago.  She saw Dr. Cleophas Dunker and he had her get an injection of the right hip by radiology in [place]. She did well until recently.  She takes an occasional Aleve but that does not help that much.  She has no trauma. The right hip has decreased motion and bothers her more at the end of the day and the first thing in the morning.  She has no weakness or paresthesias.  Right hip has decreased motion with internal of 15, external of 20, extension 5, flexion full, abduction and adduction 30.  NV intact.  Encounter Diagnosis  Name Primary?   Unilateral primary osteoarthritis, right hip Yes   X-rays were done of the right hip today, reported separately.  I will set her up for radiology to do hip injection with prednisone at [facility].  Return to see Dr. Ophelia Charter in three weeks.  She declines a Rx for Naprosyn.  Call if any problem.  Precautions discussed.  Electronically Signed Darreld Mclean, MD 8/29/202411:14 AM

## 2023-01-22 DIAGNOSIS — I129 Hypertensive chronic kidney disease with stage 1 through stage 4 chronic kidney disease, or unspecified chronic kidney disease: Secondary | ICD-10-CM | POA: Diagnosis not present

## 2023-01-22 DIAGNOSIS — I5032 Chronic diastolic (congestive) heart failure: Secondary | ICD-10-CM | POA: Diagnosis not present

## 2023-01-22 DIAGNOSIS — N1831 Chronic kidney disease, stage 3a: Secondary | ICD-10-CM | POA: Diagnosis not present

## 2023-01-22 DIAGNOSIS — N10 Acute tubulo-interstitial nephritis: Secondary | ICD-10-CM | POA: Diagnosis not present

## 2023-01-22 DIAGNOSIS — G4733 Obstructive sleep apnea (adult) (pediatric): Secondary | ICD-10-CM | POA: Diagnosis not present

## 2023-02-15 DIAGNOSIS — M79671 Pain in right foot: Secondary | ICD-10-CM | POA: Diagnosis not present

## 2023-02-15 DIAGNOSIS — G5751 Tarsal tunnel syndrome, right lower limb: Secondary | ICD-10-CM | POA: Diagnosis not present

## 2023-03-08 DIAGNOSIS — M79672 Pain in left foot: Secondary | ICD-10-CM | POA: Diagnosis not present

## 2023-03-08 DIAGNOSIS — G5752 Tarsal tunnel syndrome, left lower limb: Secondary | ICD-10-CM | POA: Diagnosis not present

## 2023-03-15 DIAGNOSIS — E1122 Type 2 diabetes mellitus with diabetic chronic kidney disease: Secondary | ICD-10-CM | POA: Diagnosis not present

## 2023-03-15 DIAGNOSIS — E7849 Other hyperlipidemia: Secondary | ICD-10-CM | POA: Diagnosis not present

## 2023-03-15 DIAGNOSIS — E1165 Type 2 diabetes mellitus with hyperglycemia: Secondary | ICD-10-CM | POA: Diagnosis not present

## 2023-03-15 DIAGNOSIS — I10 Essential (primary) hypertension: Secondary | ICD-10-CM | POA: Diagnosis not present

## 2023-03-15 DIAGNOSIS — N179 Acute kidney failure, unspecified: Secondary | ICD-10-CM | POA: Diagnosis not present

## 2023-03-22 DIAGNOSIS — E7849 Other hyperlipidemia: Secondary | ICD-10-CM | POA: Diagnosis not present

## 2023-03-22 DIAGNOSIS — R768 Other specified abnormal immunological findings in serum: Secondary | ICD-10-CM | POA: Diagnosis not present

## 2023-03-22 DIAGNOSIS — M791 Myalgia, unspecified site: Secondary | ICD-10-CM | POA: Diagnosis not present

## 2023-03-22 DIAGNOSIS — H44131 Sympathetic uveitis, right eye: Secondary | ICD-10-CM | POA: Diagnosis not present

## 2023-03-22 DIAGNOSIS — D860 Sarcoidosis of lung: Secondary | ICD-10-CM | POA: Diagnosis not present

## 2023-03-22 DIAGNOSIS — I10 Essential (primary) hypertension: Secondary | ICD-10-CM | POA: Diagnosis not present

## 2023-03-22 DIAGNOSIS — I700 Atherosclerosis of aorta: Secondary | ICD-10-CM | POA: Diagnosis not present

## 2023-03-22 DIAGNOSIS — Z0001 Encounter for general adult medical examination with abnormal findings: Secondary | ICD-10-CM | POA: Diagnosis not present

## 2023-03-22 DIAGNOSIS — E1165 Type 2 diabetes mellitus with hyperglycemia: Secondary | ICD-10-CM | POA: Diagnosis not present

## 2023-03-22 DIAGNOSIS — N1831 Chronic kidney disease, stage 3a: Secondary | ICD-10-CM | POA: Diagnosis not present

## 2023-03-22 DIAGNOSIS — T8182XA Emphysema (subcutaneous) resulting from a procedure, initial encounter: Secondary | ICD-10-CM | POA: Diagnosis not present

## 2023-03-22 DIAGNOSIS — R202 Paresthesia of skin: Secondary | ICD-10-CM | POA: Diagnosis not present

## 2023-03-29 DIAGNOSIS — G5762 Lesion of plantar nerve, left lower limb: Secondary | ICD-10-CM | POA: Diagnosis not present

## 2023-03-29 DIAGNOSIS — M79671 Pain in right foot: Secondary | ICD-10-CM | POA: Diagnosis not present

## 2023-03-29 DIAGNOSIS — G5761 Lesion of plantar nerve, right lower limb: Secondary | ICD-10-CM | POA: Diagnosis not present

## 2023-03-29 DIAGNOSIS — M79672 Pain in left foot: Secondary | ICD-10-CM | POA: Diagnosis not present

## 2023-05-18 DIAGNOSIS — R0602 Shortness of breath: Secondary | ICD-10-CM | POA: Diagnosis not present

## 2023-05-25 DIAGNOSIS — E782 Mixed hyperlipidemia: Secondary | ICD-10-CM | POA: Diagnosis not present

## 2023-05-25 DIAGNOSIS — I10 Essential (primary) hypertension: Secondary | ICD-10-CM | POA: Diagnosis not present

## 2023-05-25 DIAGNOSIS — I25118 Atherosclerotic heart disease of native coronary artery with other forms of angina pectoris: Secondary | ICD-10-CM | POA: Diagnosis not present

## 2023-05-26 DIAGNOSIS — I129 Hypertensive chronic kidney disease with stage 1 through stage 4 chronic kidney disease, or unspecified chronic kidney disease: Secondary | ICD-10-CM | POA: Diagnosis not present

## 2023-05-26 DIAGNOSIS — N1832 Chronic kidney disease, stage 3b: Secondary | ICD-10-CM | POA: Diagnosis not present

## 2023-05-26 DIAGNOSIS — I5032 Chronic diastolic (congestive) heart failure: Secondary | ICD-10-CM | POA: Diagnosis not present

## 2023-05-26 DIAGNOSIS — G4733 Obstructive sleep apnea (adult) (pediatric): Secondary | ICD-10-CM | POA: Diagnosis not present

## 2023-06-01 DIAGNOSIS — Z961 Presence of intraocular lens: Secondary | ICD-10-CM | POA: Diagnosis not present

## 2023-06-01 DIAGNOSIS — H30031 Focal chorioretinal inflammation, peripheral, right eye: Secondary | ICD-10-CM | POA: Diagnosis not present

## 2023-06-01 DIAGNOSIS — H21541 Posterior synechiae (iris), right eye: Secondary | ICD-10-CM | POA: Diagnosis not present

## 2023-06-01 DIAGNOSIS — Z79899 Other long term (current) drug therapy: Secondary | ICD-10-CM | POA: Diagnosis not present

## 2023-06-14 ENCOUNTER — Ambulatory Visit (HOSPITAL_COMMUNITY)
Admission: RE | Admit: 2023-06-14 | Discharge: 2023-06-14 | Disposition: A | Discharge status: Home / Self Care | Payer: Medicare Other | Source: Ambulatory Visit | Attending: Family Medicine | Admitting: Family Medicine

## 2023-06-14 DIAGNOSIS — Z87891 Personal history of nicotine dependence: Secondary | ICD-10-CM | POA: Diagnosis not present

## 2023-06-14 DIAGNOSIS — Z122 Encounter for screening for malignant neoplasm of respiratory organs: Secondary | ICD-10-CM | POA: Diagnosis not present

## 2023-06-14 DIAGNOSIS — F17210 Nicotine dependence, cigarettes, uncomplicated: Secondary | ICD-10-CM | POA: Diagnosis not present

## 2023-06-24 ENCOUNTER — Other Ambulatory Visit: Payer: Self-pay

## 2023-06-24 ENCOUNTER — Telehealth: Payer: Self-pay | Admitting: Acute Care

## 2023-06-24 DIAGNOSIS — Z122 Encounter for screening for malignant neoplasm of respiratory organs: Secondary | ICD-10-CM

## 2023-06-24 DIAGNOSIS — F17210 Nicotine dependence, cigarettes, uncomplicated: Secondary | ICD-10-CM

## 2023-06-24 DIAGNOSIS — Z87891 Personal history of nicotine dependence: Secondary | ICD-10-CM

## 2023-06-24 NOTE — Telephone Encounter (Signed)
 Lung Screen from January 24th

## 2023-06-25 NOTE — Telephone Encounter (Signed)
 Spoke with [name] radiology. Advised addendum is still pending. Result letter previously sent to patient advising annual CT prior to addendum. Letter reviewed Arthrosclerosis and Emphysema. I have spoken with the patient and advised Ascending thoracic aorta measuring 4.7 cm.  and advised after the results letter was sent an addendum was to be added. Patient states she sees Dr. Troy with Cardiology. Results will be faxed to PCP and Cardiology for f/u. Advised patient that if any additional f/u is needed per radiology, staff will contact her. Patient verbalized understanding and will f/u with PCP and Cardiology to discuss further imaging and evaluation if warranted.

## 2023-07-29 DIAGNOSIS — E7849 Other hyperlipidemia: Secondary | ICD-10-CM | POA: Diagnosis not present

## 2023-07-29 DIAGNOSIS — N179 Acute kidney failure, unspecified: Secondary | ICD-10-CM | POA: Diagnosis not present

## 2023-07-29 DIAGNOSIS — E1122 Type 2 diabetes mellitus with diabetic chronic kidney disease: Secondary | ICD-10-CM | POA: Diagnosis not present

## 2023-07-29 DIAGNOSIS — N1831 Chronic kidney disease, stage 3a: Secondary | ICD-10-CM | POA: Diagnosis not present

## 2023-08-05 DIAGNOSIS — N1831 Chronic kidney disease, stage 3a: Secondary | ICD-10-CM | POA: Diagnosis not present

## 2023-08-05 DIAGNOSIS — T8182XA Emphysema (subcutaneous) resulting from a procedure, initial encounter: Secondary | ICD-10-CM | POA: Diagnosis not present

## 2023-08-05 DIAGNOSIS — E7849 Other hyperlipidemia: Secondary | ICD-10-CM | POA: Diagnosis not present

## 2023-08-05 DIAGNOSIS — E782 Mixed hyperlipidemia: Secondary | ICD-10-CM | POA: Diagnosis not present

## 2023-08-05 DIAGNOSIS — E1122 Type 2 diabetes mellitus with diabetic chronic kidney disease: Secondary | ICD-10-CM | POA: Diagnosis not present

## 2023-09-02 DIAGNOSIS — Z1231 Encounter for screening mammogram for malignant neoplasm of breast: Secondary | ICD-10-CM | POA: Diagnosis not present

## 2023-09-17 DIAGNOSIS — J209 Acute bronchitis, unspecified: Secondary | ICD-10-CM | POA: Diagnosis not present

## 2023-09-17 DIAGNOSIS — D860 Sarcoidosis of lung: Secondary | ICD-10-CM | POA: Diagnosis not present

## 2023-09-25 IMAGING — RF DG FLUORO GUIDE NDL PLC/BX
1 series · 1 of 1 positions shown · non-contrast
Comparison: None

CLINICAL DATA: Therapeutic RIGHT hip injection under fluoroscopic
guidance for primary osteoarthritis RIGHT hip, RIGHT hip pain

EXAM:
THERAPEUTIC RIGHT HIP INJECTION UNDER FLUOROSCOPY

[Series 1: cp_standard · 0.18mm/px · 1 of 1 slices shown]
[im 1/1]
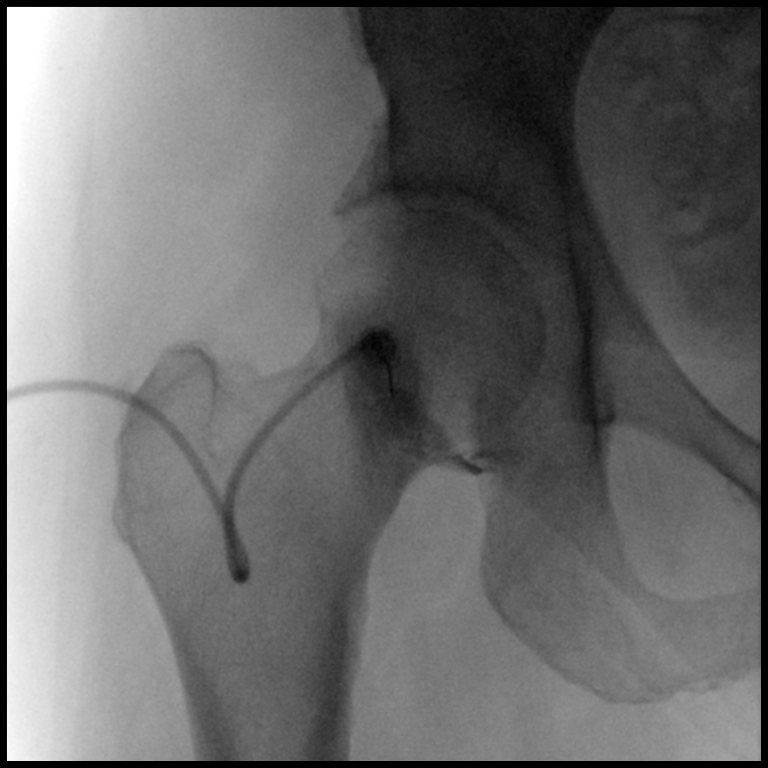

[1 of 1 positions shown; findings below may reference images not displayed]

FLUOROSCOPY TIME:  Fluoroscopy Time:  0 minutes 42 seconds

Radiation Exposure Index (if provided by the fluoroscopic device):
11.7 mGy

Number of Acquired Spot Images: 1

PROCEDURE:
Procedure, risks, benefits and alternatives explained to the
patient.

Patient's questions answered.

Written informed consent obtained.

Timeout protocol followed.

RIGHT hip joint localized by fluoroscopy.

Skin prepped and draped in usual sterile fashion.

Skin and soft tissues anesthetized with 3 mL of 1% lidocaine.

Under fluoroscopic guidance, 22 gauge spinal needle was advanced
into RIGHT hip joint.

Intra-articular position of needle tip confirmed by injection of 2
mL of [id number].

Joint was injected with 3 mL of Sensorcaine 0.5% and 40 mg of
Kenalog.

Procedure tolerated well by patient without immediate complication.
IMPRESSION: Technically successful therapeutic RIGHT hip injection under
fluoroscopy.

## 2023-10-03 NOTE — Progress Notes (Signed)
 Kristen Mcknight, female    DOB: January 11, 1951    MRN: [id number]   Brief patient profile:  70  yobf  quit smoking  2015  [place] pt self-referred back to pulmonary clinic in [facility]  10/05/2023  for sardcoidosis / f/u nodule    Onset of fatigue / sob around 1998 rx  pred 6 m and sob/ viz changes > [name]  Dr Bernetta Brilliant observation status    History of Present Illness  10/05/2023  Pulmonary/ 1st office eval/ Kristen Mcknight / [facility] M[facility]  Chief Complaint  Patient presents with   Establish Care  Dyspnea:  limited by R hip still able to do steps s sob  Cough: none  Sleep: flat bed one pillow snoring wakes her up  SABA use: none  02 NWG:NFAO  LDSCT:done  06/14/23 No more viz symptoms or need for eyedrops x years, still goes for check ups / doing fine p cataracts removed   No obvious day to day or daytime pattern/variability or assoc excess/ purulent sputum or mucus plugs or hemoptysis or cp or chest tightness, subjective wheeze or overt sinus or hb symptoms.    Also denies any obvious fluctuation of symptoms with weather or environmental changes or other aggravating or alleviating factors except as outlined above   No unusual exposure hx or h/o childhood pna/ asthma or knowledge of premature birth.  Current Allergies, Complete Past Medical History, Past Surgical History, Family History, and Social History were reviewed in [name] record.  ROS  The following are not active complaints unless bolded Hoarseness, sore throat, dysphagia, dental problems, itching, sneezing,  nasal congestion or discharge of excess mucus or purulent secretions, ear ache,   fever, chills, sweats, unintended wt loss or wt gain, classically pleuritic or exertional cp,  orthopnea pnd or arm/hand swelling  or leg swelling, presyncope, palpitations, abdominal pain, anorexia, nausea, vomiting, diarrhea  or change in bowel habits or change in bladder habits, change in stools or change in urine, dysuria,  hematuria,  rash, arthralgias, visual complaints, headache, numbness, weakness or ataxia or problems with walking or coordination,  change in mood or  memory.            Outpatient Medications Prior to Visit  Medication Sig Dispense Refill   ASPIRIN LOW DOSE 81 MG tablet Take 81 mg by mouth every other day. In the morning     carvedilol (COREG) 6.25 MG tablet Take 6.25 mg by mouth in the morning and at bedtime.     COD LIVER OIL PO Take 1 capsule by mouth 3 (three) times a week.     ezetimibe (ZETIA) 10 MG tablet Take 10 mg by mouth in the morning.     fluorometholone (FML) 0.1 % ophthalmic suspension Place 1 drop into both eyes 3 (three) times daily as needed (seeing floaters/eye pain.).     fluticasone (FLONASE) 50 MCG/ACT nasal spray Place 2 sprays into both nostrils daily as needed for allergies or rhinitis.     glipiZIDE (GLUCOTROL XL) 5 MG 24 hr tablet Take 5 mg by mouth daily with breakfast.     levofloxacin (LEVAQUIN) 500 MG tablet Take 500 mg by mouth daily.     Multiple Vitamin (MULTIVITAMIN WITH MINERALS) TABS tablet Take 1 tablet by mouth 3 (three) times a week.     rosuvastatin (CRESTOR) 10 MG tablet Take 10 mg by mouth every other day.     valsartan (DIOVAN) 40 MG tablet Take 40 mg by mouth in the morning.  FOLIC ACID  PO Take 1 tablet by mouth every other day.     No facility-administered medications prior to visit.    Past Medical History:  Diagnosis Date   Anemia    Chronic kidney disease    DM (diabetes mellitus) (HCC)     x 1 year   Hepatitis    Hyperlipidemia    Sarcoidosis       Objective:     BP (!) 153/88 (BP Location: Left Arm)   Pulse 74   Ht 5\' 6"  (1.676 m)   Wt 182 lb (82.6 kg)   SpO2 95% Comment: RA  BMI 29.38 kg/m   SpO2: 95 % (RA) pleasant amb bf nad    HEENT : Oropharynx  clear      Nasal turbinates nl    NECK :  without  apparent JVD/ palpable Nodes/TM    LUNGS: no acc muscle use,  Nl contour chest which is clear to A and P  bilaterally without cough on insp or exp maneuvers   CV:  RRR  no s3 or murmur or increase in P2, and no edema   ABD:  soft and nontender   MS:  Gait nl   ext warm without deformities Or obvious joint restrictions  calf tenderness, cyanosis or clubbing    SKIN: warm and dry without lesions    NEURO:  alert, approp, nl sensorium with  no motor or cerebellar deficits apparent.    I personally reviewed images and agree with radiology impression as follows:   Chest LDSCT   06/14/23  1. Lung-RADS 2, benign appearance or behavior. Continue annual screening with low-dose chest CT without contrast in 12 months. 2. Emphysema (ICD10-J43.9) and Aortic Atherosclerosis (ICD10-170.0)       Assessment   Sarcoidosis Onset of fatigue / sob around 1998 rx  pred 6 m and sob/ viz changes > [name]  Dr Bernetta Brilliant observation status  - pred x 6 m only/ eyedrops as well but none in years as of 10/05/2023  - LDSCT chest  tiny stable RUL nodule/ no adenopathy   A good rule of thumb is that >95% of pts with active sarcoid in any organ will have some plain cxr changes (and even more obvious changes on CT)  - on the other hand  if there are active pulmonary symptoms the cxr will look much worse than the patient:  No evidence of either scenario here/ strongly doubt active dz  > f/u with yearly LDCT appropriate s/p smoking cessation 2015  =  2030 but none needed p that.   >>> keep up with opth f/u   >>> get LDSCT as planned q 12 m  = January each year.   Thoracic aortic aneurysm (HCC) See LDSCT 06/14/23 x 4.7 cm ascending   Bp not ideal and has not yet started amlodipine, suggest she do so and low threshold to  increase the B blocker in this setting but defer to PCP/ cards planning f/u   >>> suggested target sbp < 120 for now (note 153 on arrival p taking am  valsartan and coreg )   Former cigarette smoker Quit 2015 > already in [employer] program  Discussed finding of centrilobular emphysema  =  not the same thing as  copd and no need to treat as long as no limiting doe or tendency to aecopd in which case needs pfts.  F/u 10 m to review new LDSCT in Jan 2026   Discussed in detail all the  indications, usual  risks and alternatives  relative to the benefits with patient who agrees to proceed with w/u as outlined.        Each maintenance medication was reviewed in detail including emphasizing most importantly the difference between maintenance and prns and under what circumstances the prns are to be triggered using an action plan format where appropriate.  Total time for H and P, chart review, counseling, and generating customized AVS unique to this office visit / same day charting = 45 min pt new to me          Vernestine Gondola, MD 10/05/2023

## 2023-10-04 ENCOUNTER — Ambulatory Visit: Admitting: Internal Medicine

## 2023-10-05 ENCOUNTER — Ambulatory Visit: Admitting: Internal Medicine

## 2023-10-05 ENCOUNTER — Encounter: Payer: Self-pay | Admitting: Internal Medicine

## 2023-10-05 VITALS — BP 153/88 | HR 74 | Ht 66.0 in | Wt 182.0 lb

## 2023-10-05 DIAGNOSIS — Z87891 Personal history of nicotine dependence: Secondary | ICD-10-CM | POA: Diagnosis not present

## 2023-10-05 DIAGNOSIS — I712 Thoracic aortic aneurysm, without rupture, unspecified: Secondary | ICD-10-CM | POA: Insufficient documentation

## 2023-10-05 DIAGNOSIS — D869 Sarcoidosis, unspecified: Secondary | ICD-10-CM

## 2023-10-05 NOTE — Assessment & Plan Note (Signed)
 Onset of fatigue / sob around 1998 rx  pred 6 m and sob/ viz changes > [name]  Dr Bernetta Brilliant observation status  - pred x 6 m only/ eyedrops as well but none in years as of 10/05/2023  - LDSCT chest  tiny stable RUL nodule/ no adenopathy   A good rule of thumb is that >95% of pts with active sarcoid in any organ will have some plain cxr changes (and even more obvious changes on CT)  - on the other hand  if there are active pulmonary symptoms the cxr will look much worse than the patient:  No evidence of either scenario here/ strongly doubt active dz  > f/u with yearly LDCT appropriate s/p smoking cessation 2015  =  2030 but none needed p that.   >>> keep up with opth f/u   >>> get LDSCT as planned q 12 m  = January each year.

## 2023-10-05 NOTE — Patient Instructions (Signed)
 Target systolic blood pressure is less than 120  Please schedule a follow up visit in 10  months but call sooner if needed

## 2023-10-05 NOTE — Assessment & Plan Note (Signed)
 Quit 2015 > already in LDSCT program  Discussed finding of centrilobular emphysema  =  not the same thing as copd and no need to treat as long as no limiting doe or tendency to aecopd in which case needs pfts.  F/u 10 m to review new LDSCT in Jan 2026   Discussed in detail all the  indications, usual  risks and alternatives  relative to the benefits with patient who agrees to proceed with w/u as outlined.        Each maintenance medication was reviewed in detail including emphasizing most importantly the difference between maintenance and prns and under what circumstances the prns are to be triggered using an action plan format where appropriate.  Total time for H and P, chart review, counseling, and generating customized AVS unique to this office visit / same day charting = 45 min pt new to me

## 2023-10-05 NOTE — Assessment & Plan Note (Addendum)
 See LDSCT 06/14/23 x 4.7 cm ascending   Bp not ideal and has not yet started amlodipine, suggest she do so and low threshold to  increase the B blocker in this setting but defer to PCP/ cards planning f/u   >>> suggested target sbp < 120 for now (note 153 on arrival p taking am  valsartan and coreg )

## 2023-10-13 DIAGNOSIS — Z780 Asymptomatic menopausal state: Secondary | ICD-10-CM | POA: Diagnosis not present

## 2023-11-17 DIAGNOSIS — N189 Chronic kidney disease, unspecified: Secondary | ICD-10-CM | POA: Diagnosis not present

## 2023-11-17 DIAGNOSIS — D8686 Sarcoid arthropathy: Secondary | ICD-10-CM | POA: Diagnosis not present

## 2023-11-22 DIAGNOSIS — G4733 Obstructive sleep apnea (adult) (pediatric): Secondary | ICD-10-CM | POA: Diagnosis not present

## 2023-11-22 DIAGNOSIS — N1832 Chronic kidney disease, stage 3b: Secondary | ICD-10-CM | POA: Diagnosis not present

## 2023-11-22 DIAGNOSIS — I5032 Chronic diastolic (congestive) heart failure: Secondary | ICD-10-CM | POA: Diagnosis not present

## 2023-11-22 DIAGNOSIS — I129 Hypertensive chronic kidney disease with stage 1 through stage 4 chronic kidney disease, or unspecified chronic kidney disease: Secondary | ICD-10-CM | POA: Diagnosis not present

## 2023-12-01 DIAGNOSIS — N1831 Chronic kidney disease, stage 3a: Secondary | ICD-10-CM | POA: Diagnosis not present

## 2023-12-01 DIAGNOSIS — E7849 Other hyperlipidemia: Secondary | ICD-10-CM | POA: Diagnosis not present

## 2023-12-01 DIAGNOSIS — N179 Acute kidney failure, unspecified: Secondary | ICD-10-CM | POA: Diagnosis not present

## 2023-12-01 DIAGNOSIS — E1122 Type 2 diabetes mellitus with diabetic chronic kidney disease: Secondary | ICD-10-CM | POA: Diagnosis not present

## 2023-12-06 DIAGNOSIS — N1831 Chronic kidney disease, stage 3a: Secondary | ICD-10-CM | POA: Diagnosis not present

## 2023-12-06 DIAGNOSIS — I10 Essential (primary) hypertension: Secondary | ICD-10-CM | POA: Diagnosis not present

## 2023-12-06 DIAGNOSIS — D860 Sarcoidosis of lung: Secondary | ICD-10-CM | POA: Diagnosis not present

## 2023-12-06 DIAGNOSIS — E782 Mixed hyperlipidemia: Secondary | ICD-10-CM | POA: Diagnosis not present

## 2024-01-07 ENCOUNTER — Encounter: Payer: Self-pay | Admitting: Radiology

## 2024-03-01 ENCOUNTER — Encounter (INDEPENDENT_AMBULATORY_CARE_PROVIDER_SITE_OTHER): Payer: Self-pay | Admitting: Gastroenterology

## 2024-03-20 ENCOUNTER — Encounter: Payer: Self-pay | Admitting: Radiology

## 2024-05-26 ENCOUNTER — Encounter: Payer: Self-pay | Admitting: Internal Medicine

## 2024-06-14 ENCOUNTER — Ambulatory Visit (HOSPITAL_COMMUNITY)
